# Patient Record
Sex: Male | Born: 1975 | Race: White | Hispanic: No | Marital: Married | Smoking: Current every day smoker
Health system: Southern US, Community
[De-identification: ages and names within clinical notes are randomized; demographics above are authoritative.]

## PROBLEM LIST (undated history)

## (undated) DIAGNOSIS — K219 Gastro-esophageal reflux disease without esophagitis: Secondary | ICD-10-CM

## (undated) DIAGNOSIS — G4733 Obstructive sleep apnea (adult) (pediatric): Secondary | ICD-10-CM

## (undated) DIAGNOSIS — M1990 Unspecified osteoarthritis, unspecified site: Secondary | ICD-10-CM

## (undated) DIAGNOSIS — I10 Essential (primary) hypertension: Secondary | ICD-10-CM

## (undated) HISTORY — DX: Obstructive sleep apnea (adult) (pediatric): G47.33

## (undated) HISTORY — DX: Gastro-esophageal reflux disease without esophagitis: K21.9

## (undated) HISTORY — PX: WISDOM TOOTH EXTRACTION: SHX21

## (undated) HISTORY — DX: Essential (primary) hypertension: I10

## (undated) HISTORY — DX: Unspecified osteoarthritis, unspecified site: M19.90

---

## 2010-03-28 ENCOUNTER — Ambulatory Visit (HOSPITAL_COMMUNITY)
Admission: RE | Admit: 2010-03-28 | Discharge: 2010-03-28 | Payer: Self-pay | Source: Home / Self Care | Admitting: Internal Medicine

## 2011-01-25 ENCOUNTER — Ambulatory Visit: Payer: BC Managed Care – PPO | Attending: Internal Medicine | Admitting: Sleep Medicine

## 2011-01-25 DIAGNOSIS — Z6831 Body mass index (BMI) 31.0-31.9, adult: Secondary | ICD-10-CM | POA: Insufficient documentation

## 2011-01-25 DIAGNOSIS — R404 Transient alteration of awareness: Secondary | ICD-10-CM

## 2011-01-25 DIAGNOSIS — G4733 Obstructive sleep apnea (adult) (pediatric): Secondary | ICD-10-CM | POA: Insufficient documentation

## 2011-01-26 ENCOUNTER — Encounter: Payer: Self-pay | Admitting: Sleep Medicine

## 2011-01-31 NOTE — Procedures (Signed)
NAME:  Benjamin Lane, Benjamin Lane        ACCOUNT NO.:  [id number]  MEDICAL RECORD NO.:  [id number]          PATIENT TYPE:  OUT  LOCATION:  SLEEP LAB                     FACILITY:  [facility]  PHYSICIAN:  Lonette Stevison A. Gerilyn Pilgrim, M.D. DATE OF BIRTH:  November 19, 1975  DATE OF STUDY:  01/25/2011                           NOCTURNAL POLYSOMNOGRAM  REFERRING PHYSICIAN:  Kingsley Callander. Ouida Sills, MD  REFERRING PHYSICIAN:  Kingsley Callander. Ouida Sills, MD.  INDICATIONS:  A 35 year old man, who presents with fatigue, hypersomnia, and snoring.  The study has been done to evaluate for obstructive sleep apnea syndrome.  INDICATION FOR STUDY:  EPWORTH SLEEPINESS SCORE:  MEDICATIONS:  None.  EPWORTH SLEEPINESS SCALE:  11.  BMI:  31.  ARCHITECTURAL SUMMARY:  This is a split night recording with the initial portion being a diagnostic and second portion a titration recording. The total recording time is 450 minutes.  Sleep efficiency 83%.  Sleep latency 8 minutes.  REM latency 93 minutes.  RESPIRATORY SUMMARY:  Baseline oxygen saturation 98, lowest saturation 86 during non-REM sleep.  Diagnostic AHI is 36, most of the events were hypopneas.  The patient had a fair number of events both hypopneas and frank apneas that were central in nature.  The total number of obstructive events of obstructive apneas were 11 and central 4.  The patient was started on positive pressure and was titrated on CPAP between 6 and 11.  He was subsequently switched to bilevel pressures because of the development of significant central apneas on CPAP.  The patient was titrated between pressures of 13/9 to 18/48.  Optimal pressure is 16/12 with good tolerance.  LIMB MOVEMENT SUMMARY:  PLM index 2.7.  ELECTROCARDIOGRAM SUMMARY:  Average heart rate is 77 with no significant dysrhythmias observed.  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      Cheyene Hamric A. Gerilyn Pilgrim, M.D.    KAD/MEDQ  D:  01/31/2011 09:54:34  T:  01/31/2011 15:03:24  Job:  [id number]

## 2011-03-02 ENCOUNTER — Encounter: Payer: Self-pay | Admitting: Pulmonary Disease

## 2011-03-02 ENCOUNTER — Ambulatory Visit (INDEPENDENT_AMBULATORY_CARE_PROVIDER_SITE_OTHER): Payer: BC Managed Care – PPO | Admitting: Pulmonary Disease

## 2011-03-02 VITALS — BP 124/78 | HR 79 | Temp 97.6°F | Ht 70.0 in | Wt 241.0 lb

## 2011-03-02 DIAGNOSIS — G4733 Obstructive sleep apnea (adult) (pediatric): Secondary | ICD-10-CM

## 2011-03-02 NOTE — Patient Instructions (Signed)
Will start on cpap, and see you back in 5 weeks. Please call if having tolerance issues.  Work on weight loss.

## 2011-03-02 NOTE — Assessment & Plan Note (Signed)
The patient has severe sleep apnea noted by his recent sleep study.  He has significant symptoms at night and also sleepiness during the day.  I have had a long discussion with the pt about sleep apnea, including its impact on QOL and CV health.  I have recommended starting on CPAP, while he works on weight loss.  The patient is agreeable to this approach. I will set the patient up on cpap at a moderate pressure level to allow for desensitization, and will troubleshoot the device over the next 4-6weeks if needed.  The pt is to call me if having issues with tolerance.  Will then optimize the pressure once patient is able to wear cpap on a consistent basis.

## 2011-03-02 NOTE — Progress Notes (Signed)
  Subjective:    Patient ID: Benjamin Lane, male    DOB: 1975-12-01, 35 y.o.   MRN: [id number]  HPI The patient is a 35 year old male who been asked to see for management of obstructive sleep apnea.  He recently underwent nocturnal polysomnography, and was found to have severe sleep apnea with an AHI of 36 events per hour.  Attempts were made for CPAP pressure titration, however he never had complete control of his events in the short time allotted for titration.  The patient has been noted to have loud snoring and an abnormal breathing pattern during sleep.  He has very frequent awakenings during the night, and is not rested in the mornings upon arising.  He notes significant sleep pressure during the day with periods of inactivity, and frequently will fall asleep while watching TV or reading.  He also notes some sleep pressure with driving longer distances.  The patient states that his weight is up 20 pounds over the last 2 years, and his epworth sleepiness score at the time of his sleep study was 11.  Sleep Questionnaire: What time do you typically go to bed?( Between what hours) 8:30 pm to 9 pm How long does it take you to fall asleep? 1 to 2 hours How many times during the night do you wake up? 12 What time do you get out of bed to start your day? 0600 Do you drive or operate heavy machinery in your occupation? No How much has your weight changed (up or down) over the past two years? (In pounds) 10 lb (4.536 kg) Have you ever had a sleep study before? Yes If yes, location of study? [facility] If yes, date of study? August 2012 Do you currently use CPAP? No Do you wear oxygen at any time? No     Review of Systems  Constitutional: Negative for fever and unexpected weight change.  HENT: Negative for ear pain, nosebleeds, congestion, sore throat, rhinorrhea, sneezing, trouble swallowing, dental problem, postnasal drip and sinus pressure.   Eyes: Negative for redness and itching.  Respiratory:  Positive for shortness of breath. Negative for cough, chest tightness and wheezing.   Cardiovascular: Negative for palpitations and leg swelling.  Gastrointestinal: Negative for nausea and vomiting.  Genitourinary: Negative for dysuria.  Musculoskeletal: Negative for joint swelling.  Skin: Negative for rash.  Neurological: Negative for headaches.  Hematological: Does not bruise/bleed easily.  Psychiatric/Behavioral: Negative for dysphoric mood. The patient is not nervous/anxious.        Objective:   Physical Exam Constitutional: overweight male, no acute distress  HENT:  Nares patent without discharge, but deviated septum to left with narrowing  Oropharynx without exudate, palate and uvula are moderately elongated, +tonsils.   Eyes:  Perrla, eomi, no scleral icterus  Neck:  No JVD, no TMG  Cardiovascular:  Normal rate, regular rhythm, no rubs or gallops.  No murmurs        Intact distal pulses  Pulmonary :  Normal breath sounds, no stridor or respiratory distress   No rales, rhonchi, or wheezing  Abdominal:  Soft, nondistended, bowel sounds present.  No tenderness noted.   Musculoskeletal:  No lower extremity edema noted.  Lymph Nodes:  No cervical lymphadenopathy noted  Skin:  No cyanosis noted  Neurologic:  Appears tired and sleepy,  appropriate, moves all 4 extremities without obvious deficit.         Assessment & Plan:

## 2011-04-05 ENCOUNTER — Ambulatory Visit: Payer: BC Managed Care – PPO | Admitting: Pulmonary Disease

## 2011-04-23 ENCOUNTER — Ambulatory Visit (INDEPENDENT_AMBULATORY_CARE_PROVIDER_SITE_OTHER): Payer: BC Managed Care – PPO | Admitting: Pulmonary Disease

## 2011-04-23 ENCOUNTER — Encounter: Payer: Self-pay | Admitting: Pulmonary Disease

## 2011-04-23 VITALS — BP 138/84 | HR 101 | Temp 97.9°F | Ht 70.0 in | Wt 249.4 lb

## 2011-04-23 DIAGNOSIS — G4733 Obstructive sleep apnea (adult) (pediatric): Secondary | ICD-10-CM

## 2011-04-23 NOTE — Assessment & Plan Note (Signed)
The patient has been trying to wear CPAP compliantly, but is being hindered by ongoing mask leaks.  He has been tried on different masks, but has never been put on the optimal mask from his sleep study.  At this point, we'll need to work on mask fit, and still need to optimize pressure for him.  We will contact his DME and arrange for mask fitting, and then will use the autoset mode on his machine to optimize his pressure.  I have encouraged him to work aggressively on weight loss.

## 2011-04-23 NOTE — Progress Notes (Signed)
  Subjective:    Patient ID: Benjamin Lane, male    DOB: 08-11-1975, 35 y.o.   MRN: [id number]  HPI The patient comes in today for followup of his obstructive sleep apnea.  He was started on CPAP at the last visit, but has had great difficulty with tolerance due to ongoing mask leaks.  His DME has worked with him somewhat on the leaks, but this continues to be an issue.  They are currently not willing to give him a new mask unless he purchases it.  He feels that he is really not given CPAP his best shot because of the ongoing mask issues.   Review of Systems  Constitutional: Negative for fever and unexpected weight change.  HENT: Negative for ear pain, nosebleeds, congestion, sore throat, rhinorrhea, sneezing, trouble swallowing, dental problem, postnasal drip and sinus pressure.   Eyes: Negative for redness and itching.  Respiratory: Negative for cough, chest tightness, shortness of breath and wheezing.   Cardiovascular: Negative for palpitations and leg swelling.  Gastrointestinal: Negative for nausea and vomiting.  Genitourinary: Negative for dysuria.  Musculoskeletal: Negative for joint swelling.  Skin: Negative for rash.  Neurological: Negative for headaches.  Hematological: Does not bruise/bleed easily.  Psychiatric/Behavioral: Negative for dysphoric mood. The patient is not nervous/anxious.        Objective:   Physical Exam Overweight male in no acute distress No skin breakdown or pressure necrosis from the CPAP mask Lower extremities without edema, no cyanosis noted Alert and oriented, does not appear to be sleepy, moves all 4 extremities.       Assessment & Plan:

## 2011-04-23 NOTE — Patient Instructions (Signed)
Will get mask fitting done Please call us in 2-3 weeks to give [place] update on progress.  If doing well, will optimize pressure for you on auto mode. If doing well after the above, will see back in 6mos.

## 2011-05-14 ENCOUNTER — Telehealth: Payer: Self-pay | Admitting: Pulmonary Disease

## 2011-05-14 NOTE — Telephone Encounter (Signed)
lmomtcb for pt 

## 2011-05-15 ENCOUNTER — Other Ambulatory Visit: Payer: Self-pay | Admitting: Pulmonary Disease

## 2011-05-15 DIAGNOSIS — G4733 Obstructive sleep apnea (adult) (pediatric): Secondary | ICD-10-CM

## 2011-05-15 NOTE — Telephone Encounter (Signed)
I spoke with the pt and he states that his wife says he is still snoring a lot at night and he is also still having issues with his mask. He states he is taking it off during the night. He can only wear it about 5 hours before he has to remove it. He states it feels like he is suffocating and the mask irritates his face as well. He denies any issue with pressure.  Please advise. Carron Curie, CMA

## 2011-05-15 NOTE — Telephone Encounter (Signed)
Pt is aware of [facility] recs. Pt also aware order was sent and needed nothing further

## 2011-05-15 NOTE — Telephone Encounter (Signed)
Let them know that mask fit is everything, and if not right he will not do well with cpap He needs to go to sleep center for a mask fitting.  Once this is done, then we need to optimize your pressure His snoring will continue until his mask is fitting and his pressure is where it needs to be.  Order sent to [facility].

## 2011-05-23 ENCOUNTER — Ambulatory Visit (HOSPITAL_BASED_OUTPATIENT_CLINIC_OR_DEPARTMENT_OTHER): Payer: BC Managed Care – PPO | Attending: Pulmonary Disease

## 2011-05-23 ENCOUNTER — Other Ambulatory Visit: Payer: Self-pay | Admitting: Pulmonary Disease

## 2011-05-23 DIAGNOSIS — G4733 Obstructive sleep apnea (adult) (pediatric): Secondary | ICD-10-CM

## 2011-10-24 ENCOUNTER — Ambulatory Visit: Payer: BC Managed Care – PPO | Admitting: Pulmonary Disease

## 2014-01-11 ENCOUNTER — Other Ambulatory Visit (HOSPITAL_COMMUNITY): Payer: Self-pay | Admitting: Internal Medicine

## 2014-01-11 DIAGNOSIS — R748 Abnormal levels of other serum enzymes: Secondary | ICD-10-CM

## 2014-01-15 ENCOUNTER — Ambulatory Visit (HOSPITAL_COMMUNITY)
Admission: RE | Admit: 2014-01-15 | Discharge: 2014-01-15 | Disposition: A | Discharge status: Home / Self Care | Payer: BC Managed Care – PPO | Source: Ambulatory Visit | Attending: Internal Medicine | Admitting: Internal Medicine

## 2014-01-15 DIAGNOSIS — R748 Abnormal levels of other serum enzymes: Secondary | ICD-10-CM

## 2014-01-15 DIAGNOSIS — R160 Hepatomegaly, not elsewhere classified: Secondary | ICD-10-CM | POA: Insufficient documentation

## 2014-01-15 DIAGNOSIS — K7689 Other specified diseases of liver: Secondary | ICD-10-CM | POA: Insufficient documentation

## 2017-05-24 ENCOUNTER — Other Ambulatory Visit: Payer: Self-pay

## 2017-05-24 ENCOUNTER — Emergency Department (HOSPITAL_COMMUNITY): Payer: BC Managed Care – PPO

## 2017-05-24 ENCOUNTER — Encounter (HOSPITAL_COMMUNITY): Payer: Self-pay | Admitting: Emergency Medicine

## 2017-05-24 ENCOUNTER — Emergency Department (HOSPITAL_COMMUNITY)
Admission: EM | Admit: 2017-05-24 | Discharge: 2017-05-24 | Disposition: A | Discharge status: Home / Self Care | Payer: BC Managed Care – PPO | Attending: Emergency Medicine | Admitting: Emergency Medicine

## 2017-05-24 DIAGNOSIS — Y9289 Other specified places as the place of occurrence of the external cause: Secondary | ICD-10-CM | POA: Insufficient documentation

## 2017-05-24 DIAGNOSIS — I10 Essential (primary) hypertension: Secondary | ICD-10-CM | POA: Insufficient documentation

## 2017-05-24 DIAGNOSIS — S93402A Sprain of unspecified ligament of left ankle, initial encounter: Secondary | ICD-10-CM | POA: Insufficient documentation

## 2017-05-24 DIAGNOSIS — Y9339 Activity, other involving climbing, rappelling and jumping off: Secondary | ICD-10-CM | POA: Insufficient documentation

## 2017-05-24 DIAGNOSIS — F17210 Nicotine dependence, cigarettes, uncomplicated: Secondary | ICD-10-CM | POA: Diagnosis not present

## 2017-05-24 DIAGNOSIS — X58XXXA Exposure to other specified factors, initial encounter: Secondary | ICD-10-CM | POA: Insufficient documentation

## 2017-05-24 DIAGNOSIS — Z79899 Other long term (current) drug therapy: Secondary | ICD-10-CM | POA: Insufficient documentation

## 2017-05-24 DIAGNOSIS — S99912A Unspecified injury of left ankle, initial encounter: Secondary | ICD-10-CM | POA: Diagnosis present

## 2017-05-24 DIAGNOSIS — Y999 Unspecified external cause status: Secondary | ICD-10-CM | POA: Diagnosis not present

## 2017-05-24 MED ORDER — HYDROCODONE-ACETAMINOPHEN 5-325 MG PO TABS: ORAL_TABLET | ORAL | 0 refills | Status: DC

## 2017-05-24 MED ORDER — OXYCODONE-ACETAMINOPHEN 5-325 MG PO TABS
1.0000 | ORAL_TABLET | Freq: Once | ORAL | Status: AC
  Administered 2017-05-24: 1 via ORAL
  Filled 2017-05-24: qty 1

## 2017-05-24 MED ORDER — IBUPROFEN 800 MG PO TABS
800.0000 mg | ORAL_TABLET | Freq: Once | ORAL | Status: AC
  Administered 2017-05-24: 800 mg via ORAL
  Filled 2017-05-24: qty 1

## 2017-05-24 MED ORDER — IBUPROFEN 800 MG PO TABS: 800.0000 mg | ORAL_TABLET | Freq: Three times a day (TID) | ORAL | 0 refills | Status: DC

## 2017-05-24 NOTE — Discharge Instructions (Signed)
Elevate and apply ice packs on/off to your ankle.  Use your crutches for weight bearing.  Follow-up with Dr. Pricilla Holmucker in one week.

## 2017-05-24 NOTE — ED Provider Notes (Signed)
[facility] EMERGENCY DEPARTMENT Provider Note   CSN: [id number] Arrival date & time: 05/24/17  1815     History   Chief Complaint Chief Complaint  Patient presents with  . Ankle Pain    HPI Benjamin Lane is a 41 y.o. male.  HPI   Benjamin Lane is a 41 y.o. male who presents to the Emergency Department complaining of left ankle pain for several hours.  States that he was jumping off the back of a pickup truck when he felt a "pop" to his lateral ankle.  He describes increasing pain to the ankle associated with weightbearing.  He denies other injuries, numbness of the foot or leg.  No previous lower extremity injuries.  He has not tried any therapies or medications prior to arrival.  Past Medical History:  Diagnosis Date  . Allergic rhinitis   . Arthritis   . GERD (gastroesophageal reflux disease)   . Hypertension   . OSA (obstructive sleep apnea)     Patient Active Problem List   Diagnosis Date Noted  . OSA (obstructive sleep apnea) 03/02/2011    History reviewed. No pertinent surgical history.     Home Medications    Prior to Admission medications   Medication Sig Start Date End Date Taking? Authorizing Provider  amLODipine (NORVASC) 10 MG tablet Take 10 mg by mouth daily.      [provider]    Family History Family History  Problem Relation Age of Onset  . Heart disease Maternal Grandfather   . Lung cancer Paternal Grandfather     Social History Social History   Tobacco Use  . Smoking status: Current Every Day Smoker    Packs/day: 1.50    Years: 10.00    Pack years: 15.00    Types: Cigarettes  . Smokeless tobacco: Former Engineer, waterUser  Substance Use Topics  . Alcohol use: Yes    Comment: daily  . Drug use: No     Allergies   Patient has no known allergies.   Review of Systems Review of Systems  Constitutional: Negative for chills and fever.  Musculoskeletal: Positive for arthralgias (Left ankle pain) and joint  swelling.  Skin: Negative for color change and wound.  Neurological: Negative for weakness and numbness.  All other systems reviewed and are negative.    Physical Exam Updated Vital Signs BP (!) 148/75 (BP Location: Right Arm)   Pulse 96   Temp 98.2 F (36.8 C) (Oral)   Resp 18   Ht 5\' 11"  (1.803 m)   Wt 108.9 kg (240 lb)   SpO2 100%   BMI 33.47 kg/m   Physical Exam  Constitutional: He is oriented to person, place, and time. He appears well-developed and well-nourished. No distress.  HENT:  Head: Normocephalic and atraumatic.  Cardiovascular: Normal rate, regular rhythm and intact distal pulses.  Pulmonary/Chest: Effort normal and breath sounds normal.  Musculoskeletal: He exhibits tenderness. He exhibits no edema.  Tenderness to palpation of the lateral and posterior left ankle.  Mild to moderate edema noted.  No bony deformity, no proximal tenderness.  Compartments are soft  Neurological: He is alert and oriented to person, place, and time. No sensory deficit. He exhibits normal muscle tone. Coordination normal.  Skin: Skin is warm and dry. Capillary refill takes less than 2 seconds.  Nursing note and vitals reviewed.    ED Treatments / Results  Labs (all labs ordered are listed, but only abnormal results are displayed) Labs Reviewed - No data  to display  EKG  EKG Interpretation None       Radiology Dg Foot Complete Left  Result Date: 05/24/2017 CLINICAL DATA:  Lateral left foot and ankle pain and swelling since the patient jumped off a truck today. Initial encounter. EXAM: LEFT FOOT - COMPLETE 3+ VIEW COMPARISON:  None. FINDINGS: There is no evidence of fracture or dislocation. There is no evidence of arthropathy or other focal bone abnormality. Soft tissues are unremarkable. IMPRESSION: Negative exam. Electronically Signed   By: Drusilla Kannerhomas  Dalessio M.D.   On: 05/24/2017 18:51    Procedures Procedures (including critical care time)  Medications Ordered in  ED Medications  oxyCODONE-acetaminophen (PERCOCET/ROXICET) 5-325 MG per tablet 1 tablet (not administered)  ibuprofen (ADVIL,MOTRIN) tablet 800 mg (not administered)     Initial Impression / Assessment and Plan / ED Course  I have reviewed the triage vital signs and the nursing notes.  Pertinent labs & imaging results that were available during my care of the patient were reviewed by me and considered in my medical decision making (see chart for details).     X-rays negative, neurovascularly intact.  Likely sprain.  Patient agrees to RICE therapy and ASO applied.  Patient has crutches at home.  Agrees to treatment plan with close follow-up with orthopedics in 1 week if not improving  Final Clinical Impressions(s) / ED Diagnoses   Final diagnoses:  Sprain of left ankle, unspecified ligament, initial encounter    ED Discharge Orders    None       Pauline Ausriplett, Kwaku Mostafa, PA-C 05/26/17 1257    Raeford RazorKohut, Stephen, MD 05/26/17 [phone]

## 2017-05-24 NOTE — ED Triage Notes (Signed)
Pt reports left ankle pain after jumping off the back of a truck. Pt reports heard left ankle "pop". Pt reports history of previous break. Moderate swelling noted to left ankle. DP pulses strong.

## 2018-10-30 ENCOUNTER — Ambulatory Visit (INDEPENDENT_AMBULATORY_CARE_PROVIDER_SITE_OTHER): Payer: BC Managed Care – PPO | Admitting: Orthopaedic Surgery

## 2018-10-30 ENCOUNTER — Ambulatory Visit (INDEPENDENT_AMBULATORY_CARE_PROVIDER_SITE_OTHER): Payer: BC Managed Care – PPO

## 2018-10-30 ENCOUNTER — Other Ambulatory Visit: Payer: Self-pay

## 2018-10-30 ENCOUNTER — Encounter: Payer: Self-pay | Admitting: Orthopaedic Surgery

## 2018-10-30 VITALS — Ht 71.5 in | Wt 239.0 lb

## 2018-10-30 DIAGNOSIS — M4802 Spinal stenosis, cervical region: Secondary | ICD-10-CM

## 2018-10-30 DIAGNOSIS — M542 Cervicalgia: Secondary | ICD-10-CM | POA: Diagnosis not present

## 2018-10-30 DIAGNOSIS — M5020 Other cervical disc displacement, unspecified cervical region: Secondary | ICD-10-CM | POA: Diagnosis not present

## 2018-10-30 NOTE — Progress Notes (Signed)
Office Visit Note/Orthopedic consult   Patient: Benjamin Lane           Date of Birth: Jan 02, 1976           MRN: [id number] Visit Date: 10/30/2018              Requested by: [facility] [address] [place], [place] [address] PCP: [facility]   Assessment & Plan: Visit Diagnoses:  1. Neck pain   2. Spinal stenosis of cervical region   3. Protrusion of cervical intervertebral disc     Plan: Patient had previous MRI which showed cervical stenosis and surgery was discussed and recommended at that time when he saw another surgeon.  We will set patient up for some home cervical traction since he gets relief with distraction appropriate use was discussed.  Patient has some hydrocodone but is not taking it.  He can use the ibuprofen 800 mg twice a day with food if he has increased symptoms.  We reviewed the previous MRI scan with patient and he understands he has some significant stenosis present.  We will check him back again in 4 weeks if he is having persistent symptoms will need to consider repeating an MRI and we discussed operative treatment for the condition he has to relieve the stenosis.  Patient is not having any myelopathic symptoms at this time fortunately.  Recheck 4 weeks.  Thanks the opportunity to see him in consultation.  I will keep you updated on his progress.  Follow-Up Instructions: Return in about 4 weeks (around 11/27/2018).   Orders:  Orders Placed This Encounter  Procedures  . XR Cervical Spine 2 or 3 views   No orders of the defined types were placed in this encounter.     Procedures: No procedures performed   Clinical Data: No additional findings.   Subjective: Chief Complaint  Patient presents with  . Left Shoulder - Pain    HPI 43 year old male seen with left shoulder pain that began on 10/17/2018 and patient 0 for consultation at the request of Dr. Donzetta Sprung.  Patient states he was lifting a vending machine and had  increased pain in his posterior shoulder upper arm with pain that radiated down to the elbow and it tends to change with motion of his neck.  He was treated with Flexeril at night prednisone 50 mg x 5 days and then follow-up ibuprofen.  Patient is a past smoker he quit 08/16/2016.  No previous shoulder or cervical surgery.  He has had plain radiographs 10/21/18 of the shoulder which were negative.  Previous cervical MRI scan 03/28/2010 showed disc bulge cervical stenosis C4-5 with C5-6 disc protrusion borderline right foraminal stenosis and slight bulge at C6-7 worse on the left.  Patient denies gait disturbance no problems with stairs no balance problems.  Negative for myelopathic symptoms.  Review of Systems post for past smoker quit 08/16/2016.  5-year smoker.  Positive for obstructive sleep apnea cervical spinal stenosis.  Hypertension.  Patient states Lipitor for high cholesterol.  Otherwise 14 point systems negative is obtained the HPI.   Objective: Vital Signs: Ht 5' 11.5" (1.816 m)   Wt 239 lb (108.4 kg)   BMI 32.87 kg/m   Physical Exam Constitutional:      Appearance: He is well-developed.  HENT:     Head: Normocephalic and atraumatic.  Eyes:     Pupils: Pupils are equal, round, and reactive to light.  Neck:     Thyroid:  No thyromegaly.     Trachea: No tracheal deviation.  Cardiovascular:     Rate and Rhythm: Normal rate.  Pulmonary:     Effort: Pulmonary effort is normal.     Breath sounds: No wheezing.  Abdominal:     General: Bowel sounds are normal.     Palpations: Abdomen is soft.  Skin:    General: Skin is warm and dry.     Capillary Refill: Capillary refill takes less than 2 seconds.  Neurological:     Mental Status: He is alert and oriented to person, place, and time.  Psychiatric:        Behavior: Behavior normal.        Thought Content: Thought content normal.        Judgment: Judgment normal.     Ortho Exam patient has moderate to severe left brachial plexus  tenderness positive Spurling on the left negative on the right.  Upper extremity reflexes are 2+ and symmetrical.  He gets increased pain with compression and gets relief with distraction.  Biceps triceps wrist flexion extension interossei finger extensors per fundi Supple my are all normal motor testing and symmetrical.  No lower extremity hyperreflexia negative clonus.  Normal heel toe gait.  Limitation cervical flexion and extension which reproduces his pain.  Specialty Comments:  No specialty comments available.  Imaging: Xr Cervical Spine 2 Or 3 Views  Result Date: 10/31/2018 AP lateral and swimmer's lateral cervical spine x-rays are obtained and reviewed.  This shows mild curvature on AP x-ray.  Uncovertebral changes are noted.  Reversal of normal curvature.  Spurring at C4-5 C5-6 and C6-7 with disc space narrowing. Impression: Multilevel cervical spondylosis.  Most significant in mid cervical region.     PMFS History: Patient Active Problem List   Diagnosis Date Noted  . Protrusion of cervical intervertebral disc 10/31/2018  . Spinal stenosis of cervical region 10/31/2018  . OSA (obstructive sleep apnea) 03/02/2011   Past Medical History:  Diagnosis Date  . Allergic rhinitis   . Arthritis   . GERD (gastroesophageal reflux disease)   . Hypertension   . OSA (obstructive sleep apnea)     Family History  Problem Relation Age of Onset  . Heart disease Maternal Grandfather   . Lung cancer Paternal Grandfather     History reviewed. No pertinent surgical history. Social History   Occupational History  . Occupation: [employer]    Employer: [employer]  Tobacco Use  . Smoking status: Former Smoker    Packs/day: 1.50    Years: 10.00    Pack years: 15.00    Types: Cigarettes    Last attempt to quit: 08/16/2016    Years since quitting: 2.2  . Smokeless tobacco: Former Engineer, waterUser  Substance and Sexual Activity  . Alcohol use: Yes    Comment: daily   . Drug use: No  . Sexual activity: Not on file

## 2018-10-31 ENCOUNTER — Encounter: Payer: Self-pay | Admitting: Orthopaedic Surgery

## 2018-10-31 DIAGNOSIS — M4802 Spinal stenosis, cervical region: Secondary | ICD-10-CM | POA: Insufficient documentation

## 2018-10-31 DIAGNOSIS — M5020 Other cervical disc displacement, unspecified cervical region: Secondary | ICD-10-CM | POA: Insufficient documentation

## 2018-11-13 ENCOUNTER — Encounter: Payer: Self-pay | Admitting: Orthopaedic Surgery

## 2018-11-13 ENCOUNTER — Ambulatory Visit (INDEPENDENT_AMBULATORY_CARE_PROVIDER_SITE_OTHER): Payer: BC Managed Care – PPO | Admitting: Orthopaedic Surgery

## 2018-11-13 VITALS — Ht 70.0 in | Wt 240.0 lb

## 2018-11-13 DIAGNOSIS — M5020 Other cervical disc displacement, unspecified cervical region: Secondary | ICD-10-CM | POA: Diagnosis not present

## 2018-11-13 DIAGNOSIS — M4802 Spinal stenosis, cervical region: Secondary | ICD-10-CM | POA: Diagnosis not present

## 2018-11-13 MED ORDER — OXYCODONE-ACETAMINOPHEN 5-325 MG PO TABS: 1.0000 | ORAL_TABLET | ORAL | 0 refills | Status: DC | PRN

## 2018-11-13 MED ORDER — PREDNISONE 10 MG (21) PO TBPK: ORAL_TABLET | ORAL | 0 refills | Status: DC

## 2018-11-13 MED ORDER — DIAZEPAM 5 MG PO TABS: ORAL_TABLET | ORAL | 0 refills | Status: DC

## 2018-11-13 NOTE — Progress Notes (Addendum)
Office Visit Note   Patient: Benjamin Lane           Date of Birth: 1976-04-28           MRN: [id number] Visit Date: 11/13/2018              Requested by: [facility], [facility] [address] [place], [place] [id number] PCP: [facility]   Assessment & Plan: Visit Diagnoses:  1. Protrusion of cervical intervertebral disc   2. Spinal stenosis of cervical region     Plan: Repeat prednisone 10 mg 21 tablet Dosepak given.  Percocet prescription for pain.  He needs an urgent MRI due to severe 10 out of 10 pain and left arm radicular symptoms.  He had cervical stenosis at C4-5 and has decreased sensation C6 distribution on the left.  This may be a combination of the central stenosis at C4-5 and increase in the foraminal disc protrusion on the left at C5-6.  Follow-up after urgent MRI scan. Patient has some claustrophobia Valium 5 mg number 3 tablets given.  Instructions discussed for taking for the MRI.  Addendum: Cervical MRI scan 11/13/2018 shows cervical stenosis narrowing of the canal down to 7 mm with shallow protrusion and some compression of the left C5 nerve.  C5-6 level shows spondylosis with endplate osteophytes and bulging of the disc and acute appearing left foraminal disc protrusion with minimal AP diameter of the canal measuring 9.5 mm with left foraminal encroachment compressing the left C6 nerve root.  See 6-7 level is unchanged with some mild foraminal narrowing.  I called patient and discussed with him to level cervical discectomy and fusion at C4-5 and C5-6 with allograft and plate for his cord compression and radicular symptoms on the left with radiculopathy is required.  His pain remains 10 out of 10 and surgery is been posted for 11/28/2018 with overnight stay at the hospital.  Risks of surgery was discussed including pseudoarthrosis, dysphasia, dysphonia.  Potential for some progression at the C6-7 level years to come that might become symptomatic at some  point.  Questions were elicited and answered he understands and requests we proceed. Follow-Up Instructions: No follow-ups on file.   Orders:  No orders of the defined types were placed in this encounter.  Meds ordered this encounter  Medications  . predniSONE (STERAPRED UNI-PAK 21 TAB) 10 MG (21) TBPK tablet    Sig: Take as instructed with food    Dispense:  21 tablet    Refill:  0  . oxyCODONE-acetaminophen (PERCOCET/ROXICET) 5-325 MG tablet    Sig: Take 1-2 tablets by mouth every 4 (four) hours as needed for severe pain.    Dispense:  30 tablet    Refill:  0  . diazepam (VALIUM) 5 MG tablet    Sig: Take as instructed before MRI    Dispense:  3 tablet    Refill:  0      Procedures: No procedures performed   Clinical Data: No additional findings.   Subjective: Chief Complaint  Patient presents with  . Neck - Pain  . Left Arm - Pain    HPI 43 year old male returns for follow-up visit and is had severe exacerbation of neck and left arm pain.  He is in tears, crying in the office today.  He has been using home cervical traction and broke he got another one.  He got some relief with the previous prednisone.  Previous MRI scan 2011 showed C4-5 disc protrusion with moderate  stenosis at 8 mm.  Foraminal disc at left C6-7 and 2001.  Patient has his left arm up over his head the only position he can get a little bit of relief.  He has numbness on the radial side of his left hand.  He states the pain is severe and he wants to proceed with surgery.  Review of Systems former smoker quit 2018.  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  Hypertension.  Hypercholesterolemia on Lipitor.  Negative for MI negative for CVA GI GU negative.  14 point systems updated.   Objective: Vital Signs: Ht  (1.778 m)   Wt 240 lb (108.9 kg)   BMI 34.44 kg/m   Physical Exam Constitutional:      Appearance: He is well-developed.     Comments:  Severe pain and tears.  HENT:     Head: Normocephalic and atraumatic.  Eyes:     Pupils: Pupils are equal, round, and reactive to light.  Neck:     Thyroid: No thyromegaly.     Trachea: No tracheal deviation.  Cardiovascular:     Rate and Rhythm: Normal rate.  Pulmonary:     Effort: Pulmonary effort is normal.     Breath sounds: No wheezing.  Abdominal:     General: Bowel sounds are normal.     Palpations: Abdomen is soft.  Skin:    General: Skin is warm and dry.     Capillary Refill: Capillary refill takes less than 2 seconds.  Neurological:     Mental Status: He is alert and oriented to person, place, and time.  Psychiatric:        Behavior: Behavior normal.        Thought Content: Thought content normal.        Judgment: Judgment normal.     Ortho Exam patient has severe positive Spurling on the left negative on the right severe brachial plexus tenderness.  He is holding his left arm up over his head.  There is absent biceps reflex on the left 2+ on the right 2+ brachioradialis and 2+ triceps on the right.  Decreased sensation on the radial side of his hand C6 distribution.  Two-point sensation middle finger is normal interossei wrist flexion extension on the left side are normal.  Normal heel toe gait no hyperreflexia no clonus lower extremities.  Supination pronation is intact.  Specialty Comments:  No specialty comments available.  Imaging: No results found.   PMFS History: Patient Active Problem List   Diagnosis Date Noted  . Protrusion of cervical intervertebral disc 10/31/2018  . Spinal stenosis of cervical region 10/31/2018  . OSA (obstructive sleep apnea) 03/02/2011   Past Medical History:  Diagnosis Date  . Allergic rhinitis   . Arthritis   . GERD (gastroesophageal reflux disease)   . Hypertension   . OSA (obstructive sleep apnea)     Family History  Problem Relation Age of Onset  . Heart disease Maternal Grandfather   . Lung cancer Paternal  Grandfather     No past surgical history on file. Social History   Occupational History  . 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 Smoking status: Former Smoker    Packs/day: 1.50    Years: 10.00    Pack years: 15.00    Types: Cigarettes    Last attempt to quit: 08/16/2016    Years since quitting: 2.2  .  Smokeless tobacco: Former Engineer, water and Sexual Activity  . Alcohol use: Yes    Comment: daily  . Drug use: No  . Sexual activity: Not on file

## 2018-11-13 NOTE — Addendum Note (Signed)
Addended by: Rogers Seeds on: 11/13/2018 09:51 AM   Modules accepted: Orders

## 2018-11-20 ENCOUNTER — Telehealth: Payer: Self-pay | Admitting: Radiology

## 2018-11-20 NOTE — Telephone Encounter (Signed)
Patient called and states he has had his MRI cervical spine and needs to be set up for surgery. Please advise.  CB for patient is [phone]

## 2018-11-20 NOTE — Telephone Encounter (Signed)
Surgery posted Friday [facility] main OR 12;40 PM for C4-5, C5-6 ADCF allograft and plate, overnight obs. CPT's given, blue sheet to be faxed with MRI results. I put addendum on last OV note. Pt can come in to see Fayrene Fearing for H and P and get orders put in , needs pre-cert thanks

## 2018-11-21 ENCOUNTER — Encounter (HOSPITAL_COMMUNITY): Payer: Self-pay | Admitting: *Deleted

## 2018-11-21 ENCOUNTER — Other Ambulatory Visit: Payer: Self-pay

## 2018-11-21 NOTE — Progress Notes (Signed)
Spoke with pt for pre-op call. Pt denies cardiac history or diabetes. Pt is treated for HTN.     Pt will go by Benjamin Lane for Covid Testing on Monday around 9:30 AM and then come to [facility] for surgery.   Coronavirus Screening  Have you experienced the following symptoms:  Cough NO Fever (>100.39F)  NO Runny nose NO Sore throat NO Difficulty breathing/shortness of breath  NO  Have you or a family member traveled in the last 14 days and where? NO     Patient reminded that hospital visitation restrictions are in effect and the importance of the restrictions.

## 2018-11-21 NOTE — Telephone Encounter (Signed)
Debbie, please take a look at this.  You will need enter referral and complete in our system.

## 2018-11-21 NOTE — Telephone Encounter (Signed)
Referral entered and I am working on [employer]

## 2018-11-23 NOTE — Anesthesia Preprocedure Evaluation (Addendum)
Anesthesia Evaluation  Patient identified by MRN, date of birth, ID band Patient awake    Reviewed: Allergy & Precautions, NPO status , Patient's Chart, lab work & pertinent test results  Airway Mallampati: II  TM Distance: >3 FB Neck ROM: Limited    Dental no notable dental hx. (+) Teeth Intact   Pulmonary sleep apnea , Current Smoker,    Pulmonary exam normal breath sounds clear to auscultation       Cardiovascular hypertension, Pt. on medications Normal cardiovascular exam Rhythm:Regular Rate:Normal     Neuro/Psych negative psych ROS   GI/Hepatic GERD  ,  Endo/Other    Renal/GU      Musculoskeletal   Abdominal   Peds  Hematology   Anesthesia Other Findings   Reproductive/Obstetrics                            Anesthesia Physical Anesthesia Plan  ASA: II  Anesthesia Plan: General   Post-op Pain Management:    Induction: Intravenous  PONV Risk Score and Plan: 2 and Treatment may vary due to age or medical condition, Ondansetron and Dexamethasone  Airway Management Planned: Video Laryngoscope Planned and Oral ETT  Additional Equipment:   Intra-op Plan:   Post-operative Plan: Extubation in OR  Informed Consent: I have reviewed the patients History and Physical, chart, labs and discussed the procedure including the risks, benefits and alternatives for the proposed anesthesia with the patient or authorized representative who has indicated his/her understanding and acceptance.     Dental advisory given  Plan Discussed with: CRNA  Anesthesia Plan Comments: (GA w ETT )       Anesthesia Quick Evaluation

## 2018-11-24 ENCOUNTER — Other Ambulatory Visit (HOSPITAL_COMMUNITY)
Admission: RE | Admit: 2018-11-24 | Discharge: 2018-11-24 | Disposition: A | Discharge status: Home / Self Care | Payer: BC Managed Care – PPO | Source: Ambulatory Visit | Attending: Orthopaedic Surgery | Admitting: Orthopaedic Surgery

## 2018-11-24 ENCOUNTER — Encounter (HOSPITAL_COMMUNITY)
Admission: RE | Disposition: A | Discharge status: Home / Self Care | Payer: Self-pay | Source: Home / Self Care | Attending: Orthopaedic Surgery

## 2018-11-24 ENCOUNTER — Ambulatory Visit (HOSPITAL_COMMUNITY): Payer: BC Managed Care – PPO

## 2018-11-24 ENCOUNTER — Encounter (HOSPITAL_COMMUNITY): Payer: Self-pay | Admitting: Anesthesiology

## 2018-11-24 ENCOUNTER — Other Ambulatory Visit (HOSPITAL_COMMUNITY): Payer: Self-pay | Admitting: *Deleted

## 2018-11-24 ENCOUNTER — Ambulatory Visit (HOSPITAL_COMMUNITY): Payer: BC Managed Care – PPO | Admitting: Anesthesiology

## 2018-11-24 ENCOUNTER — Other Ambulatory Visit: Payer: Self-pay

## 2018-11-24 ENCOUNTER — Observation Stay (HOSPITAL_COMMUNITY)
Admission: RE | Admit: 2018-11-24 | Discharge: 2018-11-25 | Disposition: A | Discharge status: Home / Self Care | Payer: BC Managed Care – PPO | Attending: Orthopaedic Surgery | Admitting: Orthopaedic Surgery

## 2018-11-24 DIAGNOSIS — Z1159 Encounter for screening for other viral diseases: Secondary | ICD-10-CM | POA: Insufficient documentation

## 2018-11-24 DIAGNOSIS — K219 Gastro-esophageal reflux disease without esophagitis: Secondary | ICD-10-CM | POA: Insufficient documentation

## 2018-11-24 DIAGNOSIS — Z836 Family history of other diseases of the respiratory system: Secondary | ICD-10-CM | POA: Diagnosis not present

## 2018-11-24 DIAGNOSIS — Z791 Long term (current) use of non-steroidal anti-inflammatories (NSAID): Secondary | ICD-10-CM | POA: Diagnosis not present

## 2018-11-24 DIAGNOSIS — I10 Essential (primary) hypertension: Secondary | ICD-10-CM | POA: Insufficient documentation

## 2018-11-24 DIAGNOSIS — E7800 Pure hypercholesterolemia, unspecified: Secondary | ICD-10-CM | POA: Diagnosis not present

## 2018-11-24 DIAGNOSIS — M50121 Cervical disc disorder at C4-C5 level with radiculopathy: Secondary | ICD-10-CM | POA: Diagnosis not present

## 2018-11-24 DIAGNOSIS — M4802 Spinal stenosis, cervical region: Secondary | ICD-10-CM

## 2018-11-24 DIAGNOSIS — Z87891 Personal history of nicotine dependence: Secondary | ICD-10-CM | POA: Diagnosis not present

## 2018-11-24 DIAGNOSIS — G4733 Obstructive sleep apnea (adult) (pediatric): Secondary | ICD-10-CM | POA: Insufficient documentation

## 2018-11-24 DIAGNOSIS — Z885 Allergy status to narcotic agent status: Secondary | ICD-10-CM | POA: Insufficient documentation

## 2018-11-24 DIAGNOSIS — M1990 Unspecified osteoarthritis, unspecified site: Secondary | ICD-10-CM | POA: Diagnosis not present

## 2018-11-24 DIAGNOSIS — M50221 Other cervical disc displacement at C4-C5 level: Secondary | ICD-10-CM | POA: Diagnosis present

## 2018-11-24 DIAGNOSIS — Z8249 Family history of ischemic heart disease and other diseases of the circulatory system: Secondary | ICD-10-CM | POA: Insufficient documentation

## 2018-11-24 DIAGNOSIS — Z79899 Other long term (current) drug therapy: Secondary | ICD-10-CM | POA: Insufficient documentation

## 2018-11-24 DIAGNOSIS — Z419 Encounter for procedure for purposes other than remedying health state, unspecified: Secondary | ICD-10-CM

## 2018-11-24 HISTORY — PX: ANTERIOR CERVICAL DECOMP/DISCECTOMY FUSION: SHX1161

## 2018-11-24 LAB — CBC
HCT: 46 % (ref 39.0–52.0)
Hemoglobin: 15.3 g/dL (ref 13.0–17.0)
MCH: 30.5 pg (ref 26.0–34.0)
MCHC: 33.3 g/dL (ref 30.0–36.0)
MCV: 91.8 fL (ref 80.0–100.0)
Platelets: 234 10*3/uL (ref 150–400)
RBC: 5.01 MIL/uL (ref 4.22–5.81)
RDW: 13.2 % (ref 11.5–15.5)
WBC: 9.8 10*3/uL (ref 4.0–10.5)
nRBC: 0 % (ref 0.0–0.2)

## 2018-11-24 LAB — COMPREHENSIVE METABOLIC PANEL
ALT: 25 U/L (ref 0–44)
AST: 19 U/L (ref 15–41)
Albumin: 4.1 g/dL (ref 3.5–5.0)
Alkaline Phosphatase: 78 U/L (ref 38–126)
Anion gap: 12 (ref 5–15)
BUN: 12 mg/dL (ref 6–20)
CO2: 21 mmol/L — ABNORMAL LOW (ref 22–32)
Calcium: 9.6 mg/dL (ref 8.9–10.3)
Chloride: 106 mmol/L (ref 98–111)
Creatinine, Ser: 0.87 mg/dL (ref 0.61–1.24)
GFR calc Af Amer: >60 mL/min (ref >60)
GFR calc non Af Amer: >60 mL/min (ref >60)
Glucose, Bld: 86 mg/dL (ref 70–99)
Potassium: 4 mmol/L (ref 3.5–5.1)
Sodium: 139 mmol/L (ref 135–145)
Total Bilirubin: 0.6 mg/dL (ref 0.3–1.2)
Total Protein: 7.2 g/dL (ref 6.5–8.1)

## 2018-11-24 LAB — URINALYSIS, ROUTINE W REFLEX MICROSCOPIC
Bacteria, UA: NONE SEEN
Bilirubin Urine: NEGATIVE
Glucose, UA: NEGATIVE mg/dL
Ketones, ur: NEGATIVE mg/dL
Leukocytes,Ua: NEGATIVE
Nitrite: NEGATIVE
Protein, ur: NEGATIVE mg/dL
Specific Gravity, Urine: 1.017 (ref 1.005–1.030)
pH: 6 (ref 5.0–8.0)

## 2018-11-24 LAB — SARS CORONAVIRUS 2 BY RT PCR (HOSPITAL ORDER, PERFORMED IN ~~LOC~~ HOSPITAL LAB): SARS Coronavirus 2: NEGATIVE

## 2018-11-24 LAB — SURGICAL PCR SCREEN
MRSA, PCR: NEGATIVE
Staphylococcus aureus: NEGATIVE

## 2018-11-24 SURGERY — ANTERIOR CERVICAL DECOMPRESSION/DISCECTOMY FUSION 2 LEVELS
Anesthesia: General | Site: Neck

## 2018-11-24 MED ORDER — PROPOFOL 1000 MG/100ML IV EMUL
INTRAVENOUS | Status: AC
  Filled 2018-11-24: qty 100

## 2018-11-24 MED ORDER — CHLORHEXIDINE GLUCONATE 4 % EX LIQD: 60.0000 mL | Freq: Once | CUTANEOUS | Status: DC

## 2018-11-24 MED ORDER — METHOCARBAMOL 500 MG PO TABS
500.0000 mg | ORAL_TABLET | Freq: Four times a day (QID) | ORAL | Status: DC | PRN
  Administered 2018-11-24 – 2018-11-25 (×2): 500 mg via ORAL
  Filled 2018-11-24: qty 1

## 2018-11-24 MED ORDER — GABAPENTIN 300 MG PO CAPS
300.0000 mg | ORAL_CAPSULE | Freq: Once | ORAL | Status: AC
  Administered 2018-11-24: 11:00:00 300 mg via ORAL
  Filled 2018-11-24: qty 1

## 2018-11-24 MED ORDER — MENTHOL 3 MG MT LOZG
1.0000 | LOZENGE | OROMUCOSAL | Status: DC | PRN
  Filled 2018-11-24: qty 9

## 2018-11-24 MED ORDER — POLYETHYLENE GLYCOL 3350 17 G PO PACK
17.0000 g | PACK | Freq: Every day | ORAL | Status: DC
  Administered 2018-11-24: 18:00:00 17 g via ORAL
  Filled 2018-11-24: qty 1

## 2018-11-24 MED ORDER — SUCCINYLCHOLINE CHLORIDE 20 MG/ML IJ SOLN
INTRAMUSCULAR | Status: DC | PRN
  Administered 2018-11-24: 100 mg via INTRAVENOUS

## 2018-11-24 MED ORDER — AMLODIPINE BESYLATE 5 MG PO TABS: 10.0000 mg | ORAL_TABLET | Freq: Every day | ORAL | Status: DC

## 2018-11-24 MED ORDER — SODIUM CHLORIDE 0.9% FLUSH
3.0000 mL | Freq: Two times a day (BID) | INTRAVENOUS | Status: DC
  Administered 2018-11-24: 23:00:00 3 mL via INTRAVENOUS

## 2018-11-24 MED ORDER — METHOCARBAMOL 500 MG PO TABS
ORAL_TABLET | ORAL | Status: AC
  Filled 2018-11-24: qty 1

## 2018-11-24 MED ORDER — ALBUTEROL SULFATE HFA 108 (90 BASE) MCG/ACT IN AERS
INHALATION_SPRAY | RESPIRATORY_TRACT | Status: AC
  Filled 2018-11-24: qty 6.7

## 2018-11-24 MED ORDER — PROPOFOL 10 MG/ML IV BOLUS
INTRAVENOUS | Status: AC
  Filled 2018-11-24: qty 20

## 2018-11-24 MED ORDER — LIDOCAINE 2% (20 MG/ML) 5 ML SYRINGE
INTRAMUSCULAR | Status: AC
  Filled 2018-11-24: qty 5

## 2018-11-24 MED ORDER — LACTATED RINGERS IV SOLN
INTRAVENOUS | Status: DC
  Administered 2018-11-24 (×2): via INTRAVENOUS

## 2018-11-24 MED ORDER — AMLODIPINE BESYLATE 5 MG PO TABS: 5.0000 mg | ORAL_TABLET | Freq: Every day | ORAL | Status: DC

## 2018-11-24 MED ORDER — HYDROMORPHONE HCL 1 MG/ML IJ SOLN
0.5000 mg | INTRAMUSCULAR | Status: DC | PRN
  Administered 2018-11-24: 20:00:00 0.5 mg via INTRAVENOUS
  Filled 2018-11-24: qty 0.5

## 2018-11-24 MED ORDER — ACETAMINOPHEN 325 MG PO TABS
650.0000 mg | ORAL_TABLET | ORAL | Status: DC | PRN
  Administered 2018-11-25: 650 mg via ORAL
  Filled 2018-11-24: qty 2

## 2018-11-24 MED ORDER — ACETAMINOPHEN 650 MG RE SUPP: 650.0000 mg | RECTAL | Status: DC | PRN

## 2018-11-24 MED ORDER — FENTANYL CITRATE (PF) 100 MCG/2ML IJ SOLN
INTRAMUSCULAR | Status: DC | PRN
  Administered 2018-11-24: 25 ug via INTRAVENOUS
  Administered 2018-11-24: 50 ug via INTRAVENOUS
  Administered 2018-11-24: 25 ug via INTRAVENOUS
  Administered 2018-11-24 (×2): 100 ug via INTRAVENOUS

## 2018-11-24 MED ORDER — FENTANYL CITRATE (PF) 250 MCG/5ML IJ SOLN
INTRAMUSCULAR | Status: AC
  Filled 2018-11-24: qty 5

## 2018-11-24 MED ORDER — ACETAMINOPHEN 10 MG/ML IV SOLN
1000.0000 mg | Freq: Once | INTRAVENOUS | Status: DC | PRN
  Administered 2018-11-24: 16:00:00 1000 mg via INTRAVENOUS

## 2018-11-24 MED ORDER — ROCURONIUM BROMIDE 10 MG/ML (PF) SYRINGE
PREFILLED_SYRINGE | INTRAVENOUS | Status: AC
  Filled 2018-11-24: qty 10

## 2018-11-24 MED ORDER — CEFAZOLIN SODIUM-DEXTROSE 1-4 GM/50ML-% IV SOLN
1.0000 g | Freq: Three times a day (TID) | INTRAVENOUS | Status: AC
  Administered 2018-11-24 – 2018-11-25 (×2): 1 g via INTRAVENOUS
  Filled 2018-11-24 (×2): qty 50

## 2018-11-24 MED ORDER — HYDROCODONE-ACETAMINOPHEN 7.5-325 MG PO TABS: 1.0000 | ORAL_TABLET | Freq: Once | ORAL | Status: DC | PRN

## 2018-11-24 MED ORDER — ONDANSETRON HCL 4 MG/2ML IJ SOLN
INTRAMUSCULAR | Status: DC | PRN
  Administered 2018-11-24: 4 mg via INTRAVENOUS

## 2018-11-24 MED ORDER — PROPOFOL 10 MG/ML IV BOLUS
INTRAVENOUS | Status: DC | PRN
  Administered 2018-11-24: 50 mg via INTRAVENOUS
  Administered 2018-11-24: 250 mg via INTRAVENOUS
  Administered 2018-11-24 (×2): 50 mg via INTRAVENOUS

## 2018-11-24 MED ORDER — BUPIVACAINE-EPINEPHRINE (PF) 0.25% -1:200000 IJ SOLN
INTRAMUSCULAR | Status: AC
  Filled 2018-11-24: qty 30

## 2018-11-24 MED ORDER — ONDANSETRON HCL 4 MG/2ML IJ SOLN: 4.0000 mg | Freq: Four times a day (QID) | INTRAMUSCULAR | Status: DC | PRN

## 2018-11-24 MED ORDER — OXYCODONE HCL 5 MG PO TABS
5.0000 mg | ORAL_TABLET | ORAL | Status: DC | PRN
  Administered 2018-11-24 – 2018-11-25 (×4): 5 mg via ORAL
  Filled 2018-11-24 (×4): qty 1

## 2018-11-24 MED ORDER — ATORVASTATIN CALCIUM 40 MG PO TABS
40.0000 mg | ORAL_TABLET | Freq: Every day | ORAL | Status: DC
  Administered 2018-11-24: 40 mg via ORAL
  Filled 2018-11-24: qty 1

## 2018-11-24 MED ORDER — HYDROMORPHONE HCL 1 MG/ML IJ SOLN
INTRAMUSCULAR | Status: AC
  Filled 2018-11-24: qty 1

## 2018-11-24 MED ORDER — CEFAZOLIN SODIUM-DEXTROSE 2-4 GM/100ML-% IV SOLN
2.0000 g | INTRAVENOUS | Status: AC
  Administered 2018-11-24: 2 g via INTRAVENOUS
  Filled 2018-11-24: qty 100

## 2018-11-24 MED ORDER — SUCCINYLCHOLINE CHLORIDE 200 MG/10ML IV SOSY
PREFILLED_SYRINGE | INTRAVENOUS | Status: AC
  Filled 2018-11-24: qty 10

## 2018-11-24 MED ORDER — LIDOCAINE 2% (20 MG/ML) 5 ML SYRINGE
INTRAMUSCULAR | Status: DC | PRN
  Administered 2018-11-24: 100 mg via INTRAVENOUS

## 2018-11-24 MED ORDER — ONDANSETRON HCL 4 MG PO TABS: 4.0000 mg | ORAL_TABLET | Freq: Four times a day (QID) | ORAL | Status: DC | PRN

## 2018-11-24 MED ORDER — MIDAZOLAM HCL 2 MG/2ML IJ SOLN
INTRAMUSCULAR | Status: AC
  Filled 2018-11-24: qty 2

## 2018-11-24 MED ORDER — PHENOL 1.4 % MT LIQD: 1.0000 | OROMUCOSAL | Status: DC | PRN

## 2018-11-24 MED ORDER — DEXAMETHASONE SODIUM PHOSPHATE 10 MG/ML IJ SOLN
INTRAMUSCULAR | Status: AC
  Filled 2018-11-24: qty 1

## 2018-11-24 MED ORDER — DIPHENHYDRAMINE HCL 50 MG/ML IJ SOLN
INTRAMUSCULAR | Status: AC
  Filled 2018-11-24: qty 1

## 2018-11-24 MED ORDER — SUGAMMADEX SODIUM 200 MG/2ML IV SOLN
INTRAVENOUS | Status: DC | PRN
  Administered 2018-11-24: 217.8 mg via INTRAVENOUS

## 2018-11-24 MED ORDER — PHENYLEPHRINE 40 MCG/ML (10ML) SYRINGE FOR IV PUSH (FOR BLOOD PRESSURE SUPPORT)
PREFILLED_SYRINGE | INTRAVENOUS | Status: AC
  Filled 2018-11-24: qty 10

## 2018-11-24 MED ORDER — PHENYLEPHRINE HCL (PRESSORS) 10 MG/ML IV SOLN
INTRAVENOUS | Status: DC | PRN
  Administered 2018-11-24 (×3): 80 ug via INTRAVENOUS

## 2018-11-24 MED ORDER — 0.9 % SODIUM CHLORIDE (POUR BTL) OPTIME
TOPICAL | Status: DC | PRN
  Administered 2018-11-24: 1000 mL

## 2018-11-24 MED ORDER — MIDAZOLAM HCL 5 MG/5ML IJ SOLN
INTRAMUSCULAR | Status: DC | PRN
  Administered 2018-11-24: 2 mg via INTRAVENOUS

## 2018-11-24 MED ORDER — MEPERIDINE HCL 25 MG/ML IJ SOLN: 6.2500 mg | INTRAMUSCULAR | Status: DC | PRN

## 2018-11-24 MED ORDER — DOCUSATE SODIUM 100 MG PO CAPS
100.0000 mg | ORAL_CAPSULE | Freq: Two times a day (BID) | ORAL | Status: DC
  Administered 2018-11-24: 100 mg via ORAL
  Filled 2018-11-24: qty 1

## 2018-11-24 MED ORDER — HYDROMORPHONE HCL 1 MG/ML IJ SOLN
0.2500 mg | INTRAMUSCULAR | Status: DC | PRN
  Administered 2018-11-24 (×2): 0.5 mg via INTRAVENOUS

## 2018-11-24 MED ORDER — ROCURONIUM BROMIDE 100 MG/10ML IV SOLN
INTRAVENOUS | Status: DC | PRN
  Administered 2018-11-24: 30 mg via INTRAVENOUS
  Administered 2018-11-24: 50 mg via INTRAVENOUS

## 2018-11-24 MED ORDER — SODIUM CHLORIDE 0.9 % IV SOLN: 250.0000 mL | INTRAVENOUS | Status: DC

## 2018-11-24 MED ORDER — ACETAMINOPHEN 10 MG/ML IV SOLN
INTRAVENOUS | Status: AC
  Filled 2018-11-24: qty 100

## 2018-11-24 MED ORDER — LOSARTAN POTASSIUM 50 MG PO TABS
100.0000 mg | ORAL_TABLET | Freq: Every day | ORAL | Status: DC
  Administered 2018-11-24: 18:00:00 100 mg via ORAL
  Filled 2018-11-24 (×2): qty 2

## 2018-11-24 MED ORDER — METHOCARBAMOL 1000 MG/10ML IJ SOLN
500.0000 mg | Freq: Four times a day (QID) | INTRAVENOUS | Status: DC | PRN
  Administered 2018-11-24: 23:00:00 500 mg via INTRAVENOUS
  Filled 2018-11-24: qty 5

## 2018-11-24 MED ORDER — ONDANSETRON HCL 4 MG/2ML IJ SOLN: 4.0000 mg | Freq: Once | INTRAMUSCULAR | Status: DC | PRN

## 2018-11-24 MED ORDER — ONDANSETRON HCL 4 MG/2ML IJ SOLN
INTRAMUSCULAR | Status: AC
  Filled 2018-11-24: qty 2

## 2018-11-24 MED ORDER — SODIUM CHLORIDE 0.9 % IV SOLN: INTRAVENOUS | Status: DC

## 2018-11-24 MED ORDER — BUPIVACAINE-EPINEPHRINE 0.25% -1:200000 IJ SOLN
INTRAMUSCULAR | Status: DC | PRN
  Administered 2018-11-24: 6 mL

## 2018-11-24 MED ORDER — MORPHINE SULFATE (PF) 2 MG/ML IV SOLN
4.0000 mg | Freq: Once | INTRAVENOUS | Status: AC
  Administered 2018-11-24: 11:00:00 4 mg via INTRAVENOUS
  Filled 2018-11-24: qty 2

## 2018-11-24 MED ORDER — SODIUM CHLORIDE 0.9% FLUSH: 3.0000 mL | INTRAVENOUS | Status: DC | PRN

## 2018-11-24 SURGICAL SUPPLY — 61 items
APL SKNCLS STERI-STRIP NONHPOA (GAUZE/BANDAGES/DRESSINGS) ×1
BENZOIN TINCTURE PRP APPL 2/3 (GAUZE/BANDAGES/DRESSINGS) ×3 IMPLANT
BIT DRILL SRG 14X2.2XFLT CHK (BIT) IMPLANT
BIT DRL SRG 14X2.2XFLT CHK (BIT) ×1
BLADE CLIPPER SURG (BLADE) IMPLANT
BONE CERV LORDOTIC 14.5X12X6 (Bone Implant) ×3 IMPLANT
BONE CERV LORDOTIC 14.5X12X7 (Bone Implant) ×3 IMPLANT
BUR ROUND FLUTED 4 SOFT TCH (BURR) ×1 IMPLANT
BUR ROUND FLUTED 4MM SOFT TCH (BURR) ×1
CLOSURE STERI-STRIP 1/2X4 (GAUZE/BANDAGES/DRESSINGS) ×1
CLOSURE WOUND 1/2 X4 (GAUZE/BANDAGES/DRESSINGS) ×1
CLSR STERI-STRIP ANTIMIC 1/2X4 (GAUZE/BANDAGES/DRESSINGS) ×1 IMPLANT
COLLAR CERV LO CONTOUR FIRM DE (SOFTGOODS) IMPLANT
CORD BIPOLAR FORCEPS 12FT (ELECTRODE) ×3 IMPLANT
COVER SURGICAL LIGHT HANDLE (MISCELLANEOUS) ×3 IMPLANT
COVER WAND RF STERILE (DRAPES) ×3 IMPLANT
CRADLE DONUT ADULT HEAD (MISCELLANEOUS) ×3 IMPLANT
DRAPE C-ARM 42X72 X-RAY (DRAPES) ×3 IMPLANT
DRAPE HALF SHEET 40X57 (DRAPES) ×3 IMPLANT
DRAPE MICROSCOPE LEICA (MISCELLANEOUS) ×3 IMPLANT
DRILL BIT SKYLINE 14MM (BIT) ×3
DURAPREP 6ML APPLICATOR 50/CS (WOUND CARE) ×3 IMPLANT
ELECT COATED BLADE 2.86 ST (ELECTRODE) ×3 IMPLANT
ELECT REM PT RETURN 9FT ADLT (ELECTROSURGICAL) ×3
ELECTRODE REM PT RTRN 9FT ADLT (ELECTROSURGICAL) ×1 IMPLANT
EVACUATOR 1/8 PVC DRAIN (DRAIN) ×3 IMPLANT
GAUZE SPONGE 4X4 12PLY STRL (GAUZE/BANDAGES/DRESSINGS) ×3 IMPLANT
GLOVE BIOGEL PI IND STRL 8 (GLOVE) ×2 IMPLANT
GLOVE BIOGEL PI INDICATOR 8 (GLOVE) ×4
GLOVE ORTHO TXT STRL SZ7.5 (GLOVE) ×6 IMPLANT
GOWN STRL REUS W/ TWL LRG LVL3 (GOWN DISPOSABLE) ×1 IMPLANT
GOWN STRL REUS W/ TWL XL LVL3 (GOWN DISPOSABLE) ×1 IMPLANT
GOWN STRL REUS W/TWL 2XL LVL3 (GOWN DISPOSABLE) ×3 IMPLANT
GOWN STRL REUS W/TWL LRG LVL3 (GOWN DISPOSABLE) ×3
GOWN STRL REUS W/TWL XL LVL3 (GOWN DISPOSABLE) ×3
GRAFT BNE SPCR VG2 14.5X12X6 (Bone Implant) IMPLANT
GRAFT BNE SPCR VG2 14.5X12X7 (Bone Implant) IMPLANT
HEAD HALTER (SOFTGOODS) ×3 IMPLANT
HEMOSTAT SURGICEL 2X14 (HEMOSTASIS) IMPLANT
KIT BASIN OR (CUSTOM PROCEDURE TRAY) ×3 IMPLANT
KIT TURNOVER KIT B (KITS) ×3 IMPLANT
MANIFOLD NEPTUNE II (INSTRUMENTS) IMPLANT
NDL 25GX 5/8IN NON SAFETY (NEEDLE) ×1 IMPLANT
NEEDLE 25GX 5/8IN NON SAFETY (NEEDLE) ×3 IMPLANT
NS IRRIG 1000ML POUR BTL (IV SOLUTION) ×3 IMPLANT
PACK ORTHO CERVICAL (CUSTOM PROCEDURE TRAY) ×3 IMPLANT
PAD ARMBOARD 7.5X6 YLW CONV (MISCELLANEOUS) ×6 IMPLANT
PATTIES SURGICAL .5 X.5 (GAUZE/BANDAGES/DRESSINGS) IMPLANT
PIN TEMP SKYLINE THREADED (PIN) ×2 IMPLANT
PLATE TWO LEVEL SKYLINE 30MM (Plate) ×2 IMPLANT
RESTRAINT LIMB HOLDER UNIV (RESTRAINTS) IMPLANT
SCREW VAR SELF TAP SKYLINE 14M (Screw) ×12 IMPLANT
STRIP CLOSURE SKIN 1/2X4 (GAUZE/BANDAGES/DRESSINGS) ×2 IMPLANT
SURGIFLO W/THROMBIN 8M KIT (HEMOSTASIS) IMPLANT
SUT BONE WAX W31G (SUTURE) ×3 IMPLANT
SUT VIC AB 3-0 X1 27 (SUTURE) ×3 IMPLANT
SUT VICRYL 4-0 PS2 18IN ABS (SUTURE) ×6 IMPLANT
TAPE CLOTH SURG 4X10 WHT LF (GAUZE/BANDAGES/DRESSINGS) ×2 IMPLANT
TOWEL OR 17X24 6PK STRL BLUE (TOWEL DISPOSABLE) ×3 IMPLANT
TOWEL OR 17X26 10 PK STRL BLUE (TOWEL DISPOSABLE) ×3 IMPLANT
TRAY FOLEY CATH SILVER 16FR (SET/KITS/TRAYS/PACK) IMPLANT

## 2018-11-24 NOTE — Interval H&P Note (Signed)
History and Physical Interval Note:  11/24/2018 12:27 PM  [place]  has presented today for surgery, with the diagnosis of CERVICAL STENOSIS C4-5,C5-6.  The various methods of treatment have been discussed with the patient and family. After consideration of risks, benefits and other options for treatment, the patient has consented to  Procedure(s): ANTERIOR CERVICAL DECOMPRESSION/DISCECTOMY FUSION 2 LEVELS WITH ALLOGRAFT AND PLATE (N/A) as a surgical intervention.  The patient's history has been reviewed, patient examined, no change in status, stable for surgery.  I have reviewed the patient's chart and labs.  Questions were answered to the patient's satisfaction.     Marybelle Killings

## 2018-11-24 NOTE — Anesthesia Procedure Notes (Signed)
Procedure Name: Intubation Date/Time: 11/24/2018 12:50 PM Performed by: Scheryl Darter, CRNA Pre-anesthesia Checklist: Patient identified, Emergency Drugs available, Suction available and Patient being monitored Patient Re-evaluated:Patient Re-evaluated prior to induction Oxygen Delivery Method: Circle System Utilized Preoxygenation: Pre-oxygenation with 100% oxygen Induction Type: IV induction and Rapid sequence Ventilation: Mask ventilation without difficulty Laryngoscope Size: Glidescope and 4 Grade View: Grade I Tube type: Oral Tube size: 7.5 mm Number of attempts: 1 Airway Equipment and Method: Stylet,  Oral airway,  Rigid stylet and Video-laryngoscopy Placement Confirmation: ETT inserted through vocal cords under direct vision,  positive ETCO2 and breath sounds checked- equal and bilateral Secured at: 23 cm Tube secured with: Tape Dental Injury: Teeth and Oropharynx as per pre-operative assessment  Difficulty Due To: Difficult Airway-  due to neck instability and Difficult Airway- due to reduced neck mobility Comments: Pt intubated with ease, head not moved during intubation/remained stable/DL x 1/secured

## 2018-11-24 NOTE — Op Note (Signed)
Preop diagnosis: Cervical stenosis C4-5, C5-6, C5-6 HNP.  Postop diagnosis: Same  Procedure: C4-5, C5-6 anterior cervical discectomy and fusion, allograft and plate.  Surgeon: Rodell Perna, MD  Assistant: Benjiman Core, PA-C medically necessary and present for the entire procedure.  Anesthesia: General +6 cc Marcaine local.  Implants Depuy Synthes skyline 30 mm plate 14 mm screws x6. VG2 graft 58mm at C4-5 and 37mm at C5-6    Procedure after oxygen isolation ultra clamp patient had ultra traction arm protective side with yellow pads wrist restraints applied for pull down during fluoroscopic image during the surgery preoperative Ancef timeout procedure DuraPrep area squared with towel sterile skin marker after trimming under side of his beard prior to prepping and draping.  Betadine Steri-Drape sterile male standard the head and thyroid sheets and drapes were applied.  Incision was made at the midline extending to the left splitting the platysma in line with the skin incision.  Blunt dissection above the omohyoid down to prominent spurs at C5-6 T4-5.  Needle placed at C5-6 and portion of the disc was excised with a 15 scalpel blade.  C4-5 was surgically treated first with narrow of the AP diameter of the canal down below 7 mm.  Spurs were removed disc that was protruding was removed operative microscope was used uncovertebral joints were stripped.  Once complete decompression of the dura was performed 7 mm graft was selected 6 was a little bit loose.  Traction was pulled by CRNA as a graft was countersunk 2 mm.  Spurs removed off of C5-6.  Self-retaining retractors removed to the C5-6 level and decompression using the bur Cloward curettes 1 to 2 mm Kerrisons to remove the posterior spurs remove disc material which was left paracentral causing cord compression once the dura was completely decompressed trial sizers showed 6 mm graft I gave the good fit.  Plate was selected 30 mm and held with a single spike  checked under fluoroscopy and then screws were filled checked intermittently with fluoroscopy until all screws were filled AP and lateral confirmed good position.  Tiny screwdriver was used to lock and also screws Hemovacs placed in line with the skin incision and out technique on the left side.  Trocar was cut off platysma closed with 3-0 Vicryl 4-0 Vicryl subcuticular closure.  Tincture benzoin Steri-Strips 4 x 4's tape and soft collar.

## 2018-11-24 NOTE — H&P (Signed)
Office Visit Note              Patient: Benjamin Lane                                          Date of Birth: 05-27-1976                                                  MRN: [id number] Visit Date: 11/13/2018                                                                     Requested by: Practice, [facility] [place], [place] [address] PCP: [facility]   Assessment & Plan: Visit Diagnoses:  1. Protrusion of cervical intervertebral disc   2. Spinal stenosis of cervical region     Plan: Repeat prednisone 10 mg 21 tablet Dosepak given.  Percocet prescription for pain.  He needs an urgent MRI due to severe 10 out of 10 pain and left arm radicular symptoms.  He had cervical stenosis at C4-5 and has decreased sensation C6 distribution on the left.  This may be a combination of the central stenosis at C4-5 and increase in the foraminal disc protrusion on the left at C5-6.  Follow-up after urgent MRI scan. Patient has some claustrophobia Valium 5 mg number 3 tablets given.  Instructions discussed for taking for the MRI.  Addendum: Cervical MRI scan 11/13/2018 shows cervical stenosis narrowing of the canal down to 7 mm with shallow protrusion and some compression of the left C5 nerve.  C5-6 level shows spondylosis with endplate osteophytes and bulging of the disc and acute appearing left foraminal disc protrusion with minimal AP diameter of the canal measuring 9.5 mm with left foraminal encroachment compressing the left C6 nerve root.  See 6-7 level is unchanged with some mild foraminal narrowing.  I called patient and discussed with him to level cervical discectomy and fusion at C4-5 and C5-6 with allograft and plate for his cord compression and radicular symptoms on the left with radiculopathy is required.  His pain remains 10 out of 10 and surgery is been posted for 11/28/2018 with overnight stay at the hospital.  Risks of surgery was discussed including  pseudoarthrosis, dysphasia, dysphonia.  Potential for some progression at the C6-7 level years to come that might become symptomatic at some point.  Questions were elicited and answered he understands and requests we proceed. Follow-Up Instructions: No follow-ups on file.   Orders:  No orders of the defined types were placed in this encounter.      Meds ordered this encounter  Medications  . predniSONE (STERAPRED UNI-PAK 21 TAB) 10 MG (21) TBPK tablet    Sig: Take as instructed with food    Dispense:  21 tablet    Refill:  0  . oxyCODONE-acetaminophen (PERCOCET/ROXICET) 5-325 MG tablet    Sig: Take 1-2 tablets by mouth every 4 (four) hours as needed for severe pain.  Dispense:  30 tablet    Refill:  0  . diazepam (VALIUM) 5 MG tablet    Sig: Take as instructed before MRI    Dispense:  3 tablet    Refill:  0      Procedures: No procedures performed   Clinical Data: No additional findings.   Subjective:    Chief Complaint  Patient presents with  . Neck - Pain  . Left Arm - Pain    HPI 43 year old male returns for follow-up visit and is had severe exacerbation of neck and left arm pain.  He is in tears, crying in the office today.  He has been using home cervical traction and broke he got another one.  He got some relief with the previous prednisone.  Previous MRI scan 2011 showed C4-5 disc protrusion with moderate stenosis at 8 mm.  Foraminal disc at left C6-7 and 2001.  Patient has his left arm up over his head the only position he can get a little bit of relief.  He has numbness on the radial side of his left hand.  He states the pain is severe and he wants to proceed with surgery.  Review of Systems former smoker quit 2018.  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  Hypertension.  Hypercholesterolemia on Lipitor.  Negative for MI negative for CVA GI GU negative.  14 point systems updated.    Objective: Vital Signs: Ht 5\' 10"  (1.778 m)   Wt 240 lb (108.9 kg)   BMI 34.44 kg/m   Physical Exam Constitutional:      Appearance: He is well-developed.     Comments: Severe pain and tears.  HENT:     Head: Normocephalic and atraumatic.  Eyes:     Pupils: Pupils are equal, round, and reactive to light.  Neck:     Thyroid: No thyromegaly.     Trachea: No tracheal deviation.  Cardiovascular:     Rate and Rhythm: Normal rate.  Pulmonary:     Effort: Pulmonary effort is normal.     Breath sounds: No wheezing.  Abdominal:     General: Bowel sounds are normal.     Palpations: Abdomen is soft.  Skin:    General: Skin is warm and dry.     Capillary Refill: Capillary refill takes less than 2 seconds.  Neurological:     Mental Status: He is alert and oriented to person, place, and time.  Psychiatric:        Behavior: Behavior normal.        Thought Content: Thought content normal.        Judgment: Judgment normal.     Ortho Exam patient has severe positive Spurling on the left negative on the right severe brachial plexus tenderness.  He is holding his left arm up over his head.  There is absent biceps reflex on the left 2+ on the right 2+ brachioradialis and 2+ triceps on the right.  Decreased sensation on the radial side of his hand C6 distribution.  Two-point sensation middle finger is normal interossei wrist flexion extension on the left side are normal.  Normal heel toe gait no hyperreflexia no clonus lower extremities.  Supination pronation is intact.  Specialty Comments:  No specialty comments available.  Imaging: No results found.   PMFS History:     Patient Active Problem List   Diagnosis Date Noted  . Protrusion of cervical intervertebral disc 10/31/2018  . Spinal stenosis of cervical  region 10/31/2018  . OSA (obstructive sleep apnea) 03/02/2011       Past Medical History:  Diagnosis Date  . Allergic rhinitis   . Arthritis   . GERD  (gastroesophageal reflux disease)   . Hypertension   . OSA (obstructive sleep apnea)          Family History  Problem Relation Age of Onset  . Heart disease Maternal Grandfather   . Lung cancer Paternal Grandfather     No past surgical history on file. Social History        Occupational History  . 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 Smoking status: Former Smoker    Packs/day: 1.50    Years: 10.00    Pack years: 15.00    Types: Cigarettes    Last attempt to quit: 08/16/2016    Years since quitting: 2.2  . Smokeless tobacco: Former Engineer, waterUser  Substance and Sexual Activity  . Alcohol use: Yes    Comment: daily  . Drug use: No  . Sexual activity: Not on file          Electronically signed by Eldred MangesYates, Anthoni Geerts C, MD at 11/13/2018 9:33 AM Electronically signed by Eldred MangesYates, Niles Ess C, MD at 11/20/2018 11:42 AM

## 2018-11-24 NOTE — Plan of Care (Signed)
  Problem: Education: Goal: Ability to verbalize activity precautions or restrictions will improve Outcome: Progressing Goal: Knowledge of the prescribed therapeutic regimen will improve Outcome: Progressing Goal: Understanding of discharge needs will improve Outcome: Progressing   Problem: Activity: Goal: Ability to avoid complications of mobility impairment will improve Outcome: Progressing Goal: Ability to tolerate increased activity will improve Outcome: Progressing Goal: Will remain free from falls Outcome: Progressing   Problem: Bowel/Gastric: Goal: Gastrointestinal status for postoperative course will improve Outcome: Progressing   Problem: Pain Management: Goal: Pain level will decrease Outcome: Progressing   Problem: Bladder/Genitourinary: Goal: Urinary functional status for postoperative course will improve Outcome: Progressing   Problem: Safety: Goal: Ability to remain free from injury will improve Outcome: Progressing   

## 2018-11-24 NOTE — Transfer of Care (Signed)
Immediate Anesthesia Transfer of Care Note  Patient: Benjamin Lane  Procedure(s) Performed: ANTERIOR CERVICAL DECOMPRESSION/DISCECTOMY FUSION 2 LEVELS WITH ALLOGRAFT AND PLATE (N/A Neck)  Patient Location: PACU  Anesthesia Type:General  Level of Consciousness: awake and pateint uncooperative  Airway & Oxygen Therapy: Patient Spontanous Breathing and Patient connected to face mask oxygen  Post-op Assessment: Report given to RN and Post -op Vital signs reviewed and stable  Post vital signs: Reviewed and stable  Last Vitals:  Vitals Value Taken Time  BP 144/83 11/24/2018  3:38 PM  Temp    Pulse 99 11/24/2018  3:42 PM  Resp 17 11/24/2018  3:42 PM  SpO2 95 % 11/24/2018  3:42 PM  Vitals shown include unvalidated device data.  Last Pain:  Vitals:   11/24/18 1042  TempSrc:   PainSc: 7       Patients Stated Pain Goal: 3 (19/75/88 3254)  Complications: No apparent anesthesia complications

## 2018-11-25 DIAGNOSIS — M50121 Cervical disc disorder at C4-C5 level with radiculopathy: Secondary | ICD-10-CM | POA: Diagnosis not present

## 2018-11-25 MED ORDER — METHOCARBAMOL 500 MG PO TABS: 500.0000 mg | ORAL_TABLET | Freq: Four times a day (QID) | ORAL | 0 refills | Status: AC | PRN

## 2018-11-25 MED ORDER — OXYCODONE-ACETAMINOPHEN 10-325 MG PO TABS: 1.0000 | ORAL_TABLET | Freq: Four times a day (QID) | ORAL | 0 refills | Status: AC | PRN

## 2018-11-25 MED ORDER — OXYCODONE-ACETAMINOPHEN 5-325 MG PO TABS: 1.0000 | ORAL_TABLET | ORAL | 0 refills | Status: AC | PRN

## 2018-11-25 NOTE — Progress Notes (Signed)
   Subjective: 1 Day Post-Op Procedure(s) (LRB): ANTERIOR CERVICAL DECOMPRESSION/DISCECTOMY FUSION 2 LEVELS WITH ALLOGRAFT AND PLATE (N/A) Patient reports pain as mild.    Objective: Vital signs in last 24 hours: Temp:  [97.7 F (36.5 C)-98.2 F (36.8 C)] 97.7 F (36.5 C) (06/09 0413) Pulse Rate:  [74-101] 74 (06/09 0413) Resp:  [11-21] 20 (06/09 0413) BP: (118-160)/(66-96) 118/66 (06/09 0413) SpO2:  [92 %-100 %] 96 % (06/09 0413) Weight:  [61.4 kg-108.9 kg] 61.4 kg (06/09 0357)  Intake/Output from previous day: 06/08 0701 - 06/09 0700 In: 1003 [I.V.:1003] Out: 70 [Drains:20; Blood:50] Intake/Output this shift: No intake/output data recorded.  Recent Labs    11/24/18 1051  HGB 15.3   Recent Labs    11/24/18 1051  WBC 9.8  RBC 5.01  HCT 46.0  PLT 234   Recent Labs    11/24/18 1051  NA 139  K 4.0  CL 106  CO2 21*  BUN 12  CREATININE 0.87  GLUCOSE 86  CALCIUM 9.6   No results for input(s): LABPT, INR in the last 72 hours.  Neurologically intact Dg Cervical Spine 2-3 Views  Result Date: 11/24/2018 CLINICAL DATA:  Cervical fusion EXAM: DG C-ARM 61-120 MIN; CERVICAL SPINE - 2-3 VIEW COMPARISON:  MR 11/13/2018 FINDINGS: Series of intraoperative fluoroscopic spot images document interval 2-level instrumented ACDF C4-C6. Alignment appears preserved. IMPRESSION: ACDF C4-C6. Electronically Signed   By: Lucrezia Europe M.D.   On: 11/24/2018 15:25   Dg C-arm 1-60 Min  Result Date: 11/24/2018 CLINICAL DATA:  Cervical fusion EXAM: DG C-ARM 61-120 MIN; CERVICAL SPINE - 2-3 VIEW COMPARISON:  MR 11/13/2018 FINDINGS: Series of intraoperative fluoroscopic spot images document interval 2-level instrumented ACDF C4-C6. Alignment appears preserved. IMPRESSION: ACDF C4-C6. Electronically Signed   By: Lucrezia Europe M.D.   On: 11/24/2018 15:25    Assessment/Plan: 1 Day Post-Op Procedure(s) (LRB): ANTERIOR CERVICAL DECOMPRESSION/DISCECTOMY FUSION 2 LEVELS WITH ALLOGRAFT AND PLATE (N/A)  Plan:  Discharge home. Good relief of arm pain. Still with some arm weakness.   Benjamin Lane 11/25/2018, 7:40 AM

## 2018-11-25 NOTE — Discharge Instructions (Signed)
No lifting more than 10 lbs.   Walk daily, keep collar on at all times except switch to other collar wrapped in saran wrap for showers. See Dr. Lorin Mercy in about 10 days in [facility].

## 2018-11-25 NOTE — Progress Notes (Signed)
Pt doing well. Pt given D/C instructions with verbal understanding. Pt's dressing was changed per MD order. Pt's incision is clean and dry with no sign of infection. Pt's IV and drain were removed prior to D/C. Pt D/C'd home via wheelchair per MD order. Pt is stable @ D/C and has no other needs at this time. Holli Humbles, RN

## 2018-11-25 NOTE — Anesthesia Postprocedure Evaluation (Signed)
Anesthesia Post Note  Patient: Benjamin Lane  Procedure(s) Performed: ANTERIOR CERVICAL DECOMPRESSION/DISCECTOMY FUSION 2 LEVELS WITH ALLOGRAFT AND PLATE (N/A Neck)     Patient location during evaluation: PACU Anesthesia Type: General Level of consciousness: awake and alert Pain management: pain level controlled Vital Signs Assessment: post-procedure vital signs reviewed and stable Respiratory status: spontaneous breathing, nonlabored ventilation and respiratory function stable Cardiovascular status: blood pressure returned to baseline and stable Postop Assessment: no apparent nausea or vomiting Anesthetic complications: no    Last Vitals:  Vitals:   11/25/18 0413 11/25/18 0743  BP: 118/66 128/79  Pulse: 74 79  Resp: 20 18  Temp: 36.5 C 36.7 C  SpO2: 96% 97%    Last Pain:  Vitals:   11/25/18 0915  TempSrc:   PainSc: 4    Pain Goal: Patients Stated Pain Goal: 2 (11/25/18 0825)                 Lynda Rainwater

## 2018-11-25 NOTE — Evaluation (Signed)
Occupational Therapy Evaluation Patient Details Name: Benjamin Lane MRN: [id number] DOB: 10/02/1975 Today's Date: 11/25/2018    History of Present Illness s/p C4-5, 5-6 ACDF.  H/O OSA, HTN   Clinical Impression   This 43 year old man was admitted for the above sx.  All education was completed. No further OT is needed at this time    Follow Up Recommendations  Supervision/Assistance - 24 hour    Equipment Recommendations  None recommended by OT    Recommendations for Other Services       Precautions / Restrictions Precautions Precautions: Cervical Required Braces or Orthoses: Cervical Brace(soft collar) Restrictions Weight Bearing Restrictions: No      Mobility Bed Mobility Overal bed mobility: Modified Independent             General bed mobility comments: pt has a bed which HOB raised; reviewed sidelying also  Transfers Overall transfer level: Independent                    Balance                                           ADL either performed or assessed with clinical judgement   ADL Overall ADL's : Needs assistance/impaired Eating/Feeding: Independent   Grooming: Independent;Standing   Upper Body Bathing: Set up   Lower Body Bathing: Set up   Upper Body Dressing : Minimal assistance;Moderate assistance   Lower Body Dressing: Set up   Toilet Transfer: Independent             General ADL Comments: ambulated to bathroom for bathing/dressed at EOB. Pt is able to cross legs.  Educated on cervical precautions. Pt donned soft collar himself after RN changed dressing     Vision         Perception     Praxis      Pertinent Vitals/Pain Pain Assessment: 0-10 Pain Score: 8  Pain Location: neck Pain Descriptors / Indicators: Aching Pain Intervention(s): Limited activity within patient's tolerance;Monitored during session;Repositioned;Patient requesting pain meds-RN notified     Hand Dominance Right    Extremity/Trunk Assessment Upper Extremity Assessment Upper Extremity Assessment: Generalized weakness(numb/tingling on L resolved per pt)           Communication Communication Communication: No difficulties   Cognition Arousal/Alertness: Awake/alert Behavior During Therapy: WFL for tasks assessed/performed Overall Cognitive Status: Within Functional Limits for tasks assessed                                     General Comments       Exercises Exercises: (educated on AROM only to 90 degrees)   Shoulder Instructions      Home Living Family/patient expects to be discharged to:: Private residence Living Arrangements: Children                 Bathroom Shower/Tub: Chief Strategy OfficerTub/shower unit   Bathroom Toilet: Handicapped height         Additional Comments: will hae 24/7 assistance      Prior Functioning/Environment Level of Independence: Independent                 OT Problem List:        OT Treatment/Interventions:      OT Goals(Current goals can be found in  the care plan section) Acute Rehab OT Goals Patient Stated Goal: decreased pain OT Goal Formulation: All assessment and education complete, DC therapy  OT Frequency:     Barriers to D/C:            Co-evaluation              AM-PAC OT "6 Clicks" Daily Activity     Outcome Measure Help from another person eating meals?: None Help from another person taking care of personal grooming?: None Help from another person toileting, which includes using toliet, bedpan, or urinal?: None Help from another person bathing (including washing, rinsing, drying)?: A Little Help from another person to put on and taking off regular upper body clothing?: A Little Help from another person to put on and taking off regular lower body clothing?: A Little 6 Click Score: 21   End of Session    Activity Tolerance: Patient tolerated treatment well Patient left: in bed;with call bell/phone within  reach  OT Visit Diagnosis: Muscle weakness (generalized) (M62.81)                Time: 8891-6945 OT Time Calculation (min): 19 min Charges:  OT General Charges $OT Visit: 1 Visit OT Evaluation $OT Eval Low Complexity: 1 Low  Lesle Chris, OTR/L Acute Rehabilitation Services [phone] [facility] pager [phone] office 11/25/2018  [place] 11/25/2018, 9:04 AM

## 2018-11-26 ENCOUNTER — Inpatient Hospital Stay (HOSPITAL_COMMUNITY): Admission: RE | Admit: 2018-11-26 | Payer: BC Managed Care – PPO | Source: Ambulatory Visit

## 2018-11-26 ENCOUNTER — Other Ambulatory Visit (HOSPITAL_COMMUNITY): Payer: BC Managed Care – PPO

## 2018-11-27 ENCOUNTER — Ambulatory Visit: Payer: BC Managed Care – PPO | Admitting: Orthopaedic Surgery

## 2018-11-27 ENCOUNTER — Other Ambulatory Visit: Payer: Self-pay

## 2018-11-27 NOTE — Discharge Summary (Signed)
Patient ID: Benjamin Lane MRN: [id number] DOB/AGE: 41-Jun-1977 41 y.o.  Admit date: 11/24/2018 Discharge date: 11/27/2018  Admission Diagnoses:  Active Problems:   Cervical stenosis of spine   Discharge Diagnoses:  Active Problems:   Cervical stenosis of spine  status post Procedure(s): ANTERIOR CERVICAL DECOMPRESSION/DISCECTOMY FUSION 2 LEVELS WITH ALLOGRAFT AND PLATE  Past Medical History:  Diagnosis Date  . Allergic rhinitis   . GERD (gastroesophageal reflux disease)   . Hypertension   . OSA (obstructive sleep apnea)    uses a cpap    Surgeries: Procedure(s): ANTERIOR CERVICAL DECOMPRESSION/DISCECTOMY FUSION 2 LEVELS WITH ALLOGRAFT AND PLATE on 12/19/2593   Consultants:   Discharged Condition: Improved  Hospital Course: Benjamin Lane is an 43 y.o. male who was admitted 11/24/2018 for operative treatment of cervical stenosis. Patient failed conservative treatments (please see the history and physical for the specifics) and had severe unremitting pain that affects sleep, daily activities and work/hobbies. After pre-op clearance, the patient was taken to the operating room on 11/24/2018 and underwent  Procedure(s): ANTERIOR CERVICAL DECOMPRESSION/DISCECTOMY FUSION 2 LEVELS WITH ALLOGRAFT AND PLATE.    Patient was given perioperative antibiotics:  Anti-infectives (From admission, onward)   Start     Dose/Rate Route Frequency Ordered Stop   11/24/18 2100  ceFAZolin (ANCEF) IVPB 1 g/50 mL premix     1 g 100 mL/hr over 30 Minutes Intravenous Every 8 hours 11/24/18 1656 11/25/18 0539   11/24/18 1015  ceFAZolin (ANCEF) IVPB 2g/100 mL premix     2 g 200 mL/hr over 30 Minutes Intravenous On call to O.R. 11/24/18 1001 11/24/18 1240       Patient was given sequential compression devices and early ambulation to prevent DVT.   Patient benefited maximally from hospital stay and there were no complications. At the time of discharge, the patient was urinating/moving  their bowels without difficulty, tolerating a regular diet, pain is controlled with oral pain medications and they have been cleared by PT/OT.   Recent vital signs: No data found.   Recent laboratory studies:  Recent Labs    11/24/18 1051  WBC 9.8  HGB 15.3  HCT 46.0  PLT 234  NA 139  K 4.0  CL 106  CO2 21*  BUN 12  CREATININE 0.87  GLUCOSE 86  CALCIUM 9.6     Discharge Medications:   Allergies as of 11/25/2018      Reactions   Norco [hydrocodone-acetaminophen] Other (See Comments)   Patient prefers to not take this med/formulation, as it constipates him easily      Medication List    STOP taking these medications   HYDROcodone-acetaminophen 5-325 MG tablet Commonly known as: NORCO/VICODIN   ibuprofen 200 MG tablet Commonly known as: ADVIL   ibuprofen 800 MG tablet Commonly known as: ADVIL   oxyCODONE-acetaminophen 5-325 MG tablet Commonly known as: PERCOCET/ROXICET Replaced by: oxyCODONE-acetaminophen 10-325 MG tablet     TAKE these medications   amLODipine 5 MG tablet Commonly known as: NORVASC Take 5 mg by mouth daily.   atorvastatin 40 MG tablet Commonly known as: LIPITOR Take 40 mg by mouth daily.   losartan 100 MG tablet Commonly known as: COZAAR Take 100 mg by mouth daily.   methocarbamol 500 MG tablet Commonly known as: ROBAXIN Take 1 tablet (500 mg total) by mouth every 6 (six) hours as needed for muscle spasms.   oxyCODONE-acetaminophen 10-325 MG tablet Commonly known as: Percocet Take 1-2 tablets by mouth every 6 (six) hours as needed  for pain. Post op pain Replaces: oxyCODONE-acetaminophen 5-325 MG tablet   oxyCODONE-acetaminophen 5-325 MG tablet Commonly known as: Percocet Take 1 tablet by mouth every 4 (four) hours as needed for severe pain.       Diagnostic Studies: Dg Cervical Spine 2-3 Views  Result Date: 11/24/2018 CLINICAL DATA:  Cervical fusion EXAM: DG C-ARM 61-120 MIN; CERVICAL SPINE - 2-3 VIEW COMPARISON:  MR 11/13/2018  FINDINGS: Series of intraoperative fluoroscopic spot images document interval 2-level instrumented ACDF C4-C6. Alignment appears preserved. IMPRESSION: ACDF C4-C6. Electronically Signed   By: Corlis Leak  Hassell M.D.   On: 11/24/2018 15:25   Dg C-arm 1-60 Min  Result Date: 11/24/2018 CLINICAL DATA:  Cervical fusion EXAM: DG C-ARM 61-120 MIN; CERVICAL SPINE - 2-3 VIEW COMPARISON:  MR 11/13/2018 FINDINGS: Series of intraoperative fluoroscopic spot images document interval 2-level instrumented ACDF C4-C6. Alignment appears preserved. IMPRESSION: ACDF C4-C6. Electronically Signed   By: Corlis Leak  Hassell M.D.   On: 11/24/2018 15:25   Xr Cervical Spine 2 Or 3 Views  Result Date: 10/31/2018 AP lateral and swimmer's lateral cervical spine x-rays are obtained and reviewed.  This shows mild curvature on AP x-ray.  Uncovertebral changes are noted.  Reversal of normal curvature.  Spurring at C4-5 C5-6 and C6-7 with disc space narrowing. Impression: Multilevel cervical spondylosis.  Most significant in mid cervical region.      Follow-up Information    Eldred MangesYates, Mark C, MD Follow up in 9 day(s).   Specialty: Orthopedic Surgery Why: [facility] Contact information: [address] [place] [address] [phone]        Eldred MangesYates, Mark C, MD .   Specialty: Orthopedic Surgery Contact information: [address] [place] [address] [phone]           Discharge Plan:  discharge to home  Disposition:     Signed: Zonia KiefJames Darcey Cardy for Annell GreeningMark Yates MD 11/27/2018, 10:20 AM

## 2018-11-28 ENCOUNTER — Encounter (HOSPITAL_COMMUNITY): Payer: Self-pay | Admitting: Orthopaedic Surgery

## 2018-12-04 ENCOUNTER — Encounter: Payer: Self-pay | Admitting: Orthopaedic Surgery

## 2018-12-04 ENCOUNTER — Other Ambulatory Visit: Payer: Self-pay

## 2018-12-04 ENCOUNTER — Ambulatory Visit (INDEPENDENT_AMBULATORY_CARE_PROVIDER_SITE_OTHER): Payer: BC Managed Care – PPO | Admitting: Orthopaedic Surgery

## 2018-12-04 ENCOUNTER — Ambulatory Visit (INDEPENDENT_AMBULATORY_CARE_PROVIDER_SITE_OTHER): Payer: BC Managed Care – PPO

## 2018-12-04 VITALS — Ht 70.0 in | Wt 235.0 lb

## 2018-12-04 DIAGNOSIS — M4322 Fusion of spine, cervical region: Secondary | ICD-10-CM

## 2018-12-04 DIAGNOSIS — Z981 Arthrodesis status: Secondary | ICD-10-CM

## 2018-12-04 NOTE — Progress Notes (Addendum)
   Post-Op Visit Note   Patient: Benjamin Lane           Date of Birth: 03-08-1976           MRN: [id number] Visit Date: 12/04/2018 PCP: [facility]   Assessment & Plan: Post C4-5 C5 ACDF.  Is not taking any pain medication good relief her preop pain.  Incision looks good Steri-Strips changed extra collar coverage.  Return 4 weeks for lateral flexion-extension C-spine x-ray and AP x-ray on return.  Chief Complaint:  Chief Complaint  Patient presents with  . Neck - Routine Post Op    11/24/2018 C4-5, C5-6 ACDF, Allograft, Plate   Visit Diagnoses:  1. Fusion of spine of cervical region   2. S/P cervical spinal fusion     Plan: Work note given no lifting or driving x4 weeks.  Recheck 4 weeks.  We can discuss work resumption if his x-rays look good.  Currently he is working from home due to the coronavirus.  Follow-Up Instructions: Return in about 4 weeks (around 01/01/2019).   Orders:  Orders Placed This Encounter  Procedures  . XR Cervical Spine 2 or 3 views   No orders of the defined types were placed in this encounter.   Imaging: No results found.  PMFS History: Patient Active Problem List   Diagnosis Date Noted  . S/P cervical spinal fusion 12/04/2018  . Spinal stenosis of cervical region 10/31/2018  . OSA (obstructive sleep apnea) 03/02/2011   Past Medical History:  Diagnosis Date  . Allergic rhinitis   . GERD (gastroesophageal reflux disease)   . Hypertension   . OSA (obstructive sleep apnea)    uses a cpap    Family History  Problem Relation Age of Onset  . Heart disease Maternal Grandfather   . Lung cancer Paternal Grandfather     Past Surgical History:  Procedure Laterality Date  . ANTERIOR CERVICAL DECOMP/DISCECTOMY FUSION N/A 11/24/2018   Procedure: ANTERIOR CERVICAL DECOMPRESSION/DISCECTOMY FUSION 2 LEVELS WITH ALLOGRAFT AND PLATE;  Surgeon: Marybelle Killings, MD;  Location: [facility];  Service: Orthopedics;  Laterality: N/A;  . WISDOM  TOOTH EXTRACTION     Social History   Occupational History  . 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 Smoking status: Current Every Day Smoker    Packs/day: 1.00    Years: 10.00    Pack years: 10.00    Types: Cigarettes    Last attempt to quit: 08/16/2016    Years since quitting: 2.3  . Smokeless tobacco: Former Network engineer and Sexual Activity  . 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 2, 2020  . Drug use: No  . Sexual activity: Not on file

## 2019-01-08 ENCOUNTER — Encounter: Payer: Self-pay | Admitting: Orthopaedic Surgery

## 2019-01-08 ENCOUNTER — Ambulatory Visit (INDEPENDENT_AMBULATORY_CARE_PROVIDER_SITE_OTHER): Payer: BC Managed Care – PPO

## 2019-01-08 ENCOUNTER — Ambulatory Visit (INDEPENDENT_AMBULATORY_CARE_PROVIDER_SITE_OTHER): Payer: BC Managed Care – PPO | Admitting: Orthopaedic Surgery

## 2019-01-08 ENCOUNTER — Other Ambulatory Visit: Payer: Self-pay

## 2019-01-08 VITALS — BP 147/92 | HR 85 | Ht 70.0 in | Wt 240.0 lb

## 2019-01-08 DIAGNOSIS — M4322 Fusion of spine, cervical region: Secondary | ICD-10-CM | POA: Diagnosis not present

## 2019-01-09 NOTE — Progress Notes (Signed)
   Post-Op Visit Note   Patient: Benjamin Lane           Date of Birth: 05-Sep-1975           MRN: [id number] Visit Date: 01/08/2019 PCP: [facility]   Assessment & Plan: Post C4-5 C5-6 ACDF. Good relief of pre-op pain. xrays show no motion. He may resume regular activities.   Chief Complaint:  Chief Complaint  Patient presents with  . Neck - Routine Post Op   Visit Diagnoses:  1. Fusion of spine of cervical region     Plan: Patient is happy the surgical result x-ray results were reviewed.  He is released from care and can return on an as-needed basis.  Follow-Up Instructions: No follow-ups on file.   Orders:  Orders Placed This Encounter  Procedures  . XR Cervical Spine 2 or 3 views   No orders of the defined types were placed in this encounter.   Imaging: No results found.  PMFS History: Patient Active Problem List   Diagnosis Date Noted  . S/P cervical spinal fusion 12/04/2018  . Spinal stenosis of cervical region 10/31/2018  . OSA (obstructive sleep apnea) 03/02/2011   Past Medical History:  Diagnosis Date  . Allergic rhinitis   . GERD (gastroesophageal reflux disease)   . Hypertension   . OSA (obstructive sleep apnea)    uses a cpap    Family History  Problem Relation Age of Onset  . Heart disease Maternal Grandfather   . Lung cancer Paternal Grandfather     Past Surgical History:  Procedure Laterality Date  . ANTERIOR CERVICAL DECOMP/DISCECTOMY FUSION N/A 11/24/2018   Procedure: ANTERIOR CERVICAL DECOMPRESSION/DISCECTOMY FUSION 2 LEVELS WITH ALLOGRAFT AND PLATE;  Surgeon: Marybelle Killings, MD;  Location: [facility];  Service: Orthopedics;  Laterality: N/A;  . WISDOM TOOTH EXTRACTION     Social History   Occupational History  . 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 Smoking status: Current Every Day Smoker    Packs/day: 1.00    Years: 10.00    Pack years: 10.00    Types:  Cigarettes    Last attempt to quit: 08/16/2016    Years since quitting: 2.4  . Smokeless tobacco: Former Network engineer and Sexual Activity  . 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 2, 2020  . Drug use: No  . Sexual activity: Not on file

## 2019-08-11 IMAGING — RF CERVICAL SPINE - 2-3 VIEW
1 series · 5 of 5 positions shown · non-contrast
Comparison: MR 11/13/2018

CLINICAL DATA: Cervical fusion

EXAM:
DG C-ARM 61-120 MIN; CERVICAL SPINE - 2-3 VIEW

[Series 1: run · 5 of 5 slices shown]
[im 1/5]
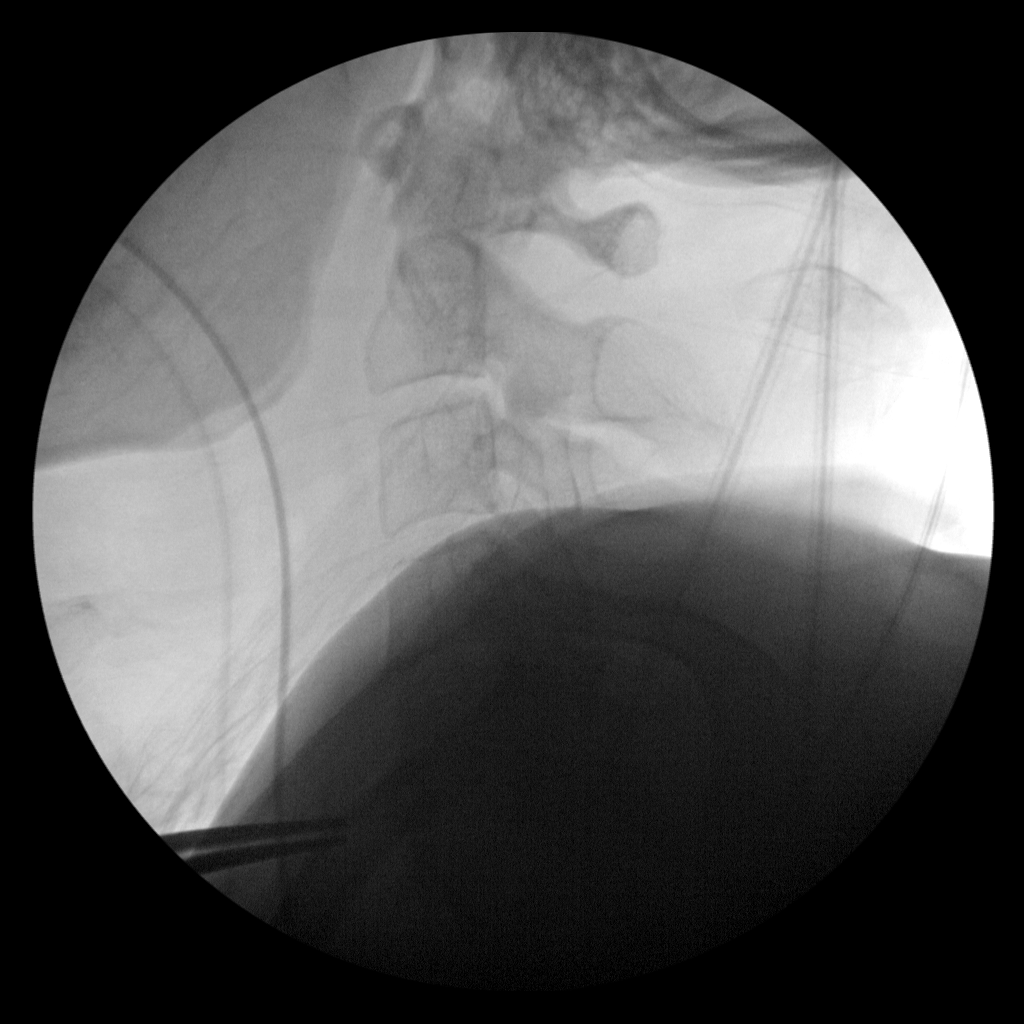
[im 2/5]
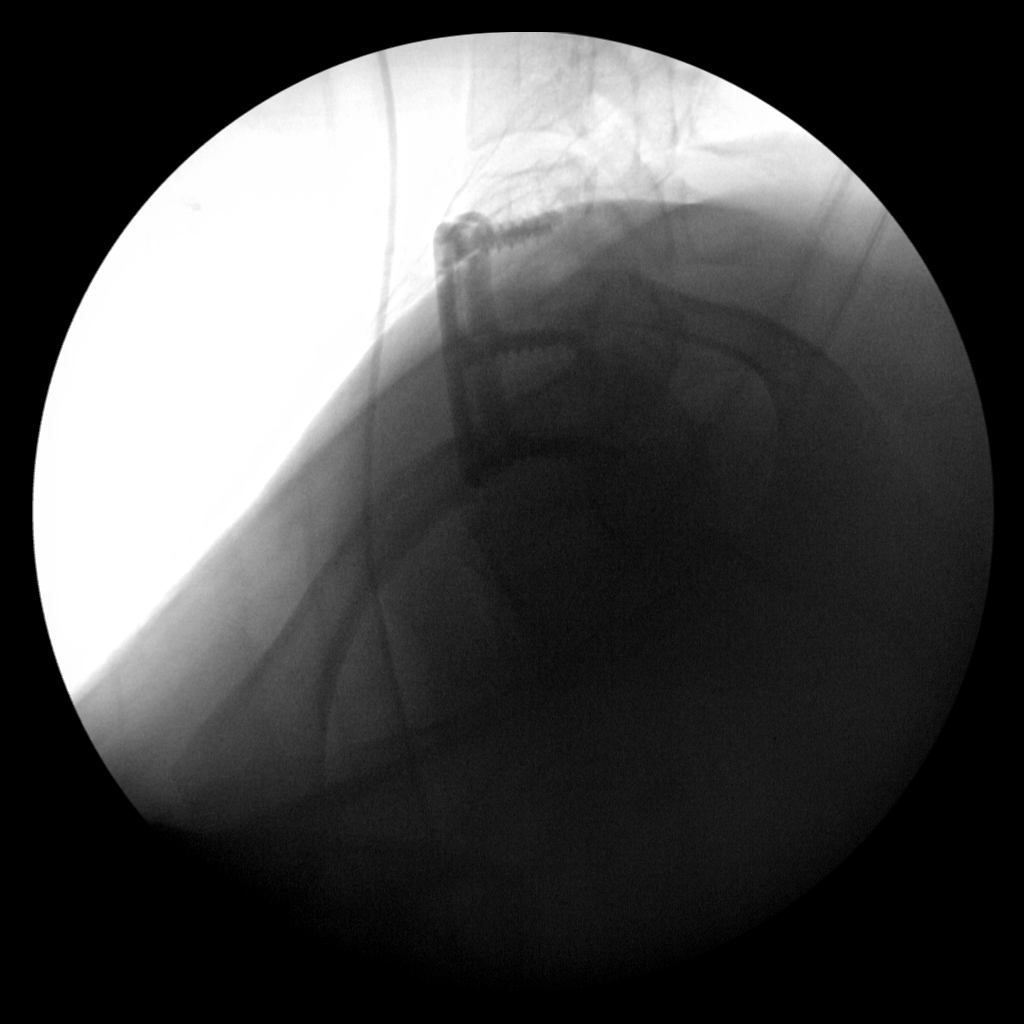
[im 3/5]
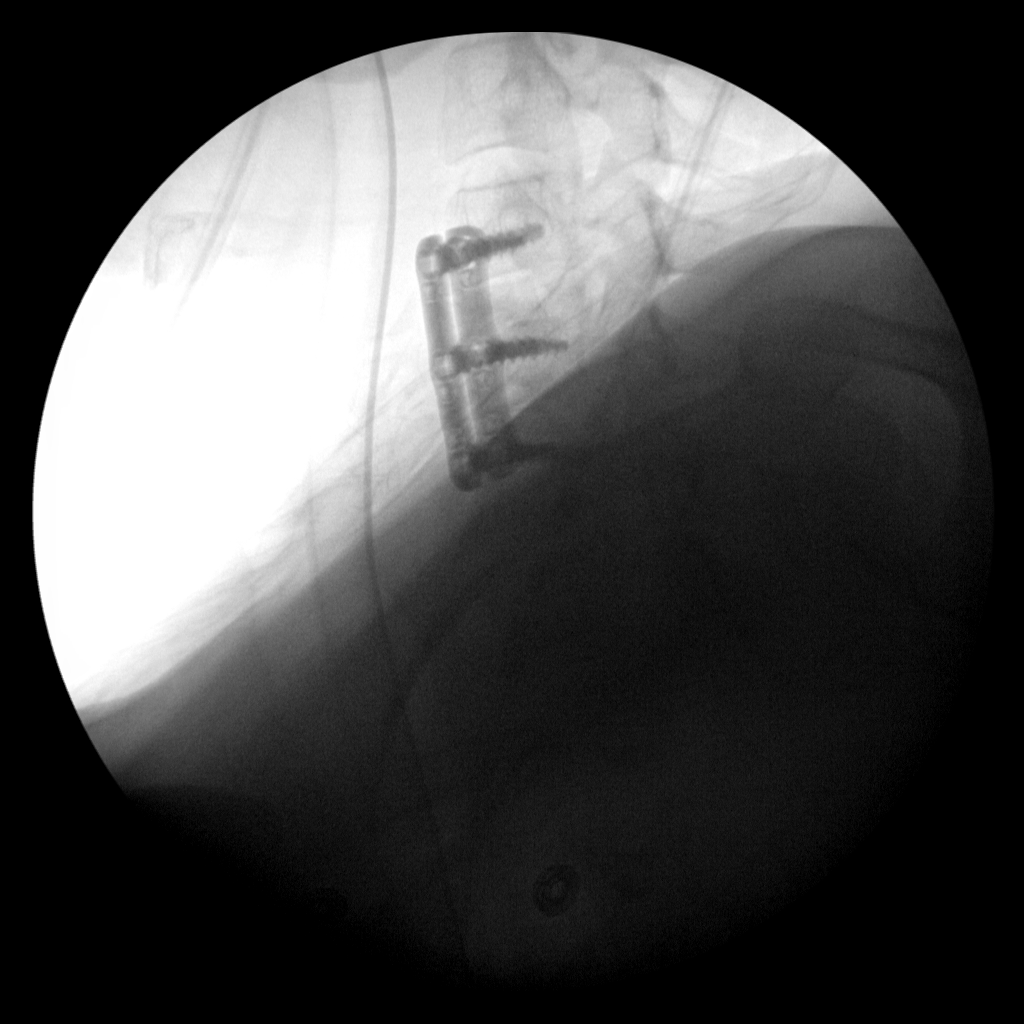
[im 4/5]
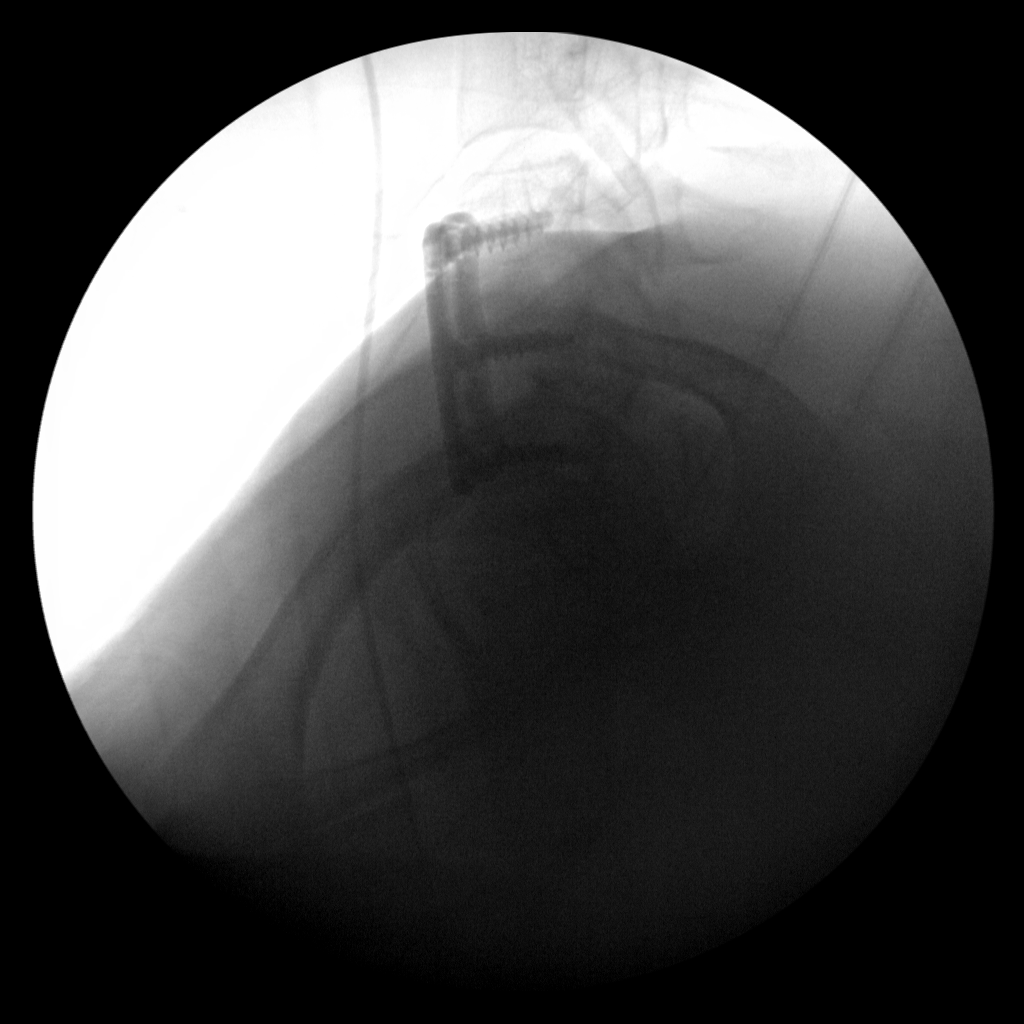
[im 5/5]
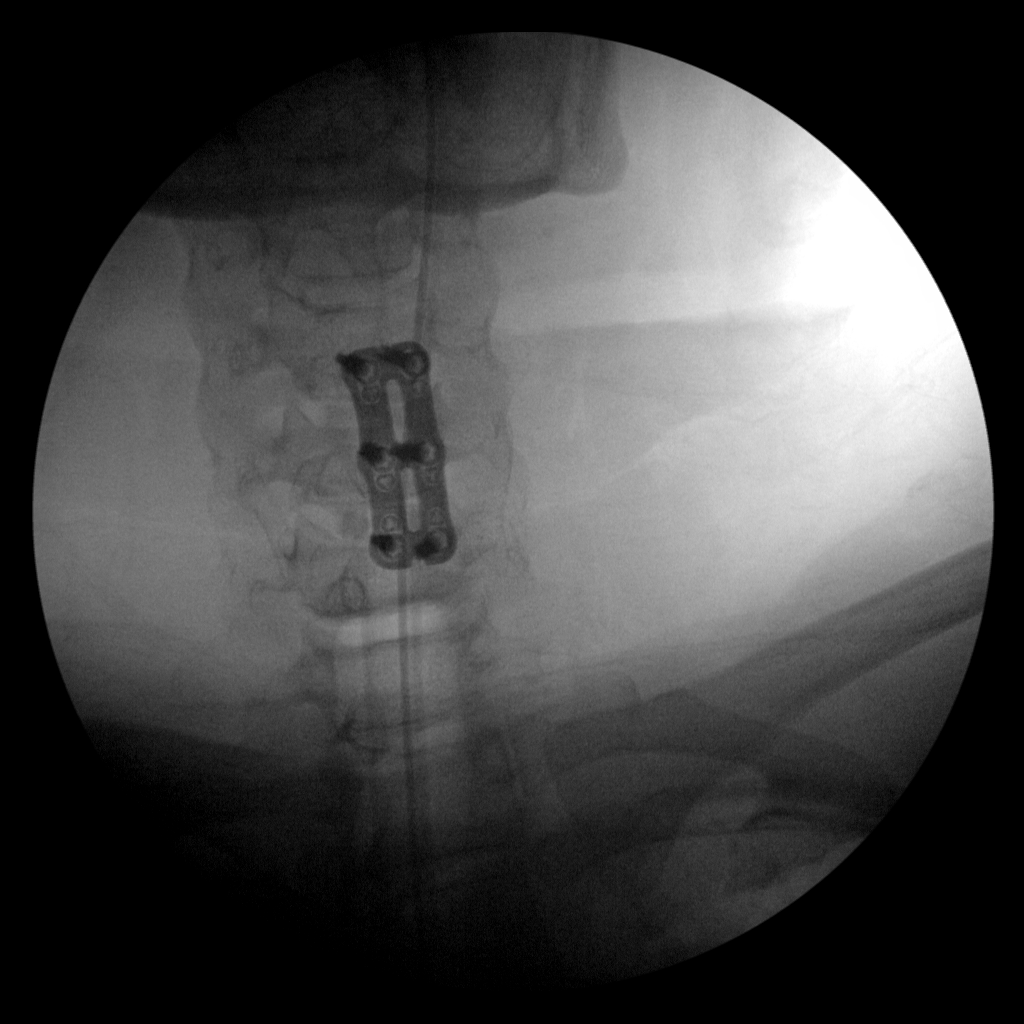

[5 of 5 positions shown; findings below may reference images not displayed]

FINDINGS: Series of intraoperative fluoroscopic spot images document interval
2-level instrumented ACDF C4-C6. Alignment appears preserved.
IMPRESSION: ACDF C4-C6.

## 2023-08-29 DIAGNOSIS — I10 Essential (primary) hypertension: Secondary | ICD-10-CM | POA: Diagnosis not present

## 2023-08-29 DIAGNOSIS — Z6832 Body mass index (BMI) 32.0-32.9, adult: Secondary | ICD-10-CM | POA: Diagnosis not present

## 2023-08-29 DIAGNOSIS — F410 Panic disorder [episodic paroxysmal anxiety] without agoraphobia: Secondary | ICD-10-CM | POA: Diagnosis not present

## 2023-08-29 DIAGNOSIS — G4733 Obstructive sleep apnea (adult) (pediatric): Secondary | ICD-10-CM | POA: Diagnosis not present

## 2023-08-29 DIAGNOSIS — E7849 Other hyperlipidemia: Secondary | ICD-10-CM | POA: Diagnosis not present

## 2023-08-29 DIAGNOSIS — E782 Mixed hyperlipidemia: Secondary | ICD-10-CM | POA: Diagnosis not present

## 2024-03-15 DIAGNOSIS — Z1212 Encounter for screening for malignant neoplasm of rectum: Secondary | ICD-10-CM | POA: Diagnosis not present
